# Patient Record
Sex: Male | Born: 1965 | Race: White | Hispanic: No | Smoking: Former smoker
Health system: Southern US, Community
[De-identification: ages and names within clinical notes are randomized; demographics above are authoritative.]

## PROBLEM LIST (undated history)

## (undated) DIAGNOSIS — A6920 Lyme disease, unspecified: Secondary | ICD-10-CM

## (undated) DIAGNOSIS — I10 Essential (primary) hypertension: Secondary | ICD-10-CM

## (undated) DIAGNOSIS — M5030 Other cervical disc degeneration, unspecified cervical region: Secondary | ICD-10-CM

## (undated) DIAGNOSIS — E119 Type 2 diabetes mellitus without complications: Secondary | ICD-10-CM

## (undated) HISTORY — PX: ANKLE SURGERY: SHX546

## (undated) HISTORY — PX: BACK SURGERY: SHX140

## (undated) HISTORY — PX: KNEE SURGERY: SHX244

## (undated) HISTORY — PX: OTHER SURGICAL HISTORY: SHX169

## (undated) HISTORY — PX: SHOULDER SURGERY: SHX246

---

## 2004-02-24 ENCOUNTER — Emergency Department: Payer: Self-pay | Admitting: Emergency Medicine

## 2005-01-04 ENCOUNTER — Emergency Department: Payer: Self-pay | Admitting: Emergency Medicine

## 2015-01-15 ENCOUNTER — Other Ambulatory Visit: Payer: Self-pay | Admitting: Orthopedic Surgery

## 2015-01-15 DIAGNOSIS — M25512 Pain in left shoulder: Secondary | ICD-10-CM

## 2015-01-15 DIAGNOSIS — Z139 Encounter for screening, unspecified: Secondary | ICD-10-CM

## 2015-01-31 ENCOUNTER — Other Ambulatory Visit: Payer: Self-pay | Admitting: Orthopedic Surgery

## 2015-01-31 ENCOUNTER — Ambulatory Visit
Admission: RE | Admit: 2015-01-31 | Discharge: 2015-01-31 | Disposition: A | Discharge status: Home / Self Care | Payer: Medicare Other | Source: Ambulatory Visit | Attending: Orthopedic Surgery | Admitting: Orthopedic Surgery

## 2015-01-31 DIAGNOSIS — M25512 Pain in left shoulder: Secondary | ICD-10-CM

## 2015-01-31 DIAGNOSIS — Z139 Encounter for screening, unspecified: Secondary | ICD-10-CM

## 2015-02-12 ENCOUNTER — Ambulatory Visit
Admission: RE | Admit: 2015-02-12 | Discharge: 2015-02-12 | Disposition: A | Discharge status: Home / Self Care | Payer: Medicare Other | Source: Ambulatory Visit | Attending: Orthopedic Surgery | Admitting: Orthopedic Surgery

## 2015-02-12 DIAGNOSIS — M25512 Pain in left shoulder: Secondary | ICD-10-CM

## 2015-02-12 MED ORDER — IOHEXOL 180 MG/ML  SOLN
15.0000 mL | Freq: Once | INTRAMUSCULAR | Status: DC | PRN
  Administered 2015-02-12: 15 mL via INTRA_ARTICULAR

## 2015-09-07 ENCOUNTER — Emergency Department
Admission: EM | Admit: 2015-09-07 | Discharge: 2015-09-07 | Disposition: A | Discharge status: Home / Self Care | Payer: Medicare Other | Attending: Student | Admitting: Student

## 2015-09-07 ENCOUNTER — Emergency Department: Payer: Medicare Other

## 2015-09-07 DIAGNOSIS — J209 Acute bronchitis, unspecified: Secondary | ICD-10-CM | POA: Diagnosis not present

## 2015-09-07 DIAGNOSIS — R05 Cough: Secondary | ICD-10-CM | POA: Diagnosis present

## 2015-09-07 DIAGNOSIS — J40 Bronchitis, not specified as acute or chronic: Secondary | ICD-10-CM

## 2015-09-07 LAB — URINALYSIS COMPLETE WITH MICROSCOPIC (ARMC ONLY)
BILIRUBIN URINE: NEGATIVE
Bacteria, UA: NONE SEEN
GLUCOSE, UA: NEGATIVE mg/dL
HGB URINE DIPSTICK: NEGATIVE
Ketones, ur: NEGATIVE mg/dL
Leukocytes, UA: NEGATIVE
Nitrite: NEGATIVE
Protein, ur: NEGATIVE mg/dL
SPECIFIC GRAVITY, URINE: 1.017 (ref 1.005–1.030)
SQUAMOUS EPITHELIAL / LPF: NONE SEEN
pH: 7 (ref 5.0–8.0)

## 2015-09-07 LAB — BASIC METABOLIC PANEL
ANION GAP: 9 (ref 5–15)
BUN: 11 mg/dL (ref 6–20)
CALCIUM: 9.5 mg/dL (ref 8.9–10.3)
CO2: 28 mmol/L (ref 22–32)
Chloride: 105 mmol/L (ref 101–111)
Creatinine, Ser: 1.14 mg/dL (ref 0.61–1.24)
GFR calc Af Amer: >60 mL/min (ref >60)
Glucose, Bld: 106 mg/dL — ABNORMAL HIGH (ref 65–99)
POTASSIUM: 3.8 mmol/L (ref 3.5–5.1)
Sodium: 142 mmol/L (ref 135–145)

## 2015-09-07 LAB — CBC
HCT: 44.8 % (ref 40.0–52.0)
HEMOGLOBIN: 15.1 g/dL (ref 13.0–18.0)
MCH: 30.8 pg (ref 26.0–34.0)
MCHC: 33.7 g/dL (ref 32.0–36.0)
MCV: 91.4 fL (ref 80.0–100.0)
Platelets: 188 10*3/uL (ref 150–440)
RBC: 4.9 MIL/uL (ref 4.40–5.90)
RDW: 13.9 % (ref 11.5–14.5)
WBC: 9.9 10*3/uL (ref 3.8–10.6)

## 2015-09-07 LAB — GLUCOSE, CAPILLARY: GLUCOSE-CAPILLARY: 104 mg/dL — AB (ref 65–99)

## 2015-09-07 MED ORDER — AZITHROMYCIN 500 MG PO TABS
500.0000 mg | ORAL_TABLET | Freq: Once | ORAL | Status: AC
  Administered 2015-09-07: 500 mg via ORAL
  Filled 2015-09-07: qty 1

## 2015-09-07 MED ORDER — KETOROLAC TROMETHAMINE 10 MG PO TABS: 10.0000 mg | ORAL_TABLET | Freq: Four times a day (QID) | ORAL | Status: DC | PRN

## 2015-09-07 MED ORDER — KETOROLAC TROMETHAMINE 60 MG/2ML IM SOLN
30.0000 mg | Freq: Once | INTRAMUSCULAR | Status: AC
  Administered 2015-09-07: 30 mg via INTRAMUSCULAR
  Filled 2015-09-07: qty 2

## 2015-09-07 MED ORDER — PREDNISONE 20 MG PO TABS
60.0000 mg | ORAL_TABLET | Freq: Once | ORAL | Status: AC
  Administered 2015-09-07: 60 mg via ORAL
  Filled 2015-09-07: qty 3

## 2015-09-07 MED ORDER — BENZONATATE 100 MG PO CAPS
200.0000 mg | ORAL_CAPSULE | Freq: Once | ORAL | Status: AC
  Administered 2015-09-07: 200 mg via ORAL
  Filled 2015-09-07: qty 2

## 2015-09-07 MED ORDER — HYDROCOD POLST-CPM POLST ER 10-8 MG/5ML PO SUER: 5.0000 mL | Freq: Two times a day (BID) | ORAL | Status: DC

## 2015-09-07 MED ORDER — AZITHROMYCIN 500 MG PO TABS: 500.0000 mg | ORAL_TABLET | Freq: Every day | ORAL | Status: DC

## 2015-09-07 MED ORDER — HYDROCOD POLST-CPM POLST ER 10-8 MG/5ML PO SUER
5.0000 mL | Freq: Once | ORAL | Status: AC
  Administered 2015-09-07: 5 mL via ORAL
  Filled 2015-09-07: qty 5

## 2015-09-07 NOTE — ED Notes (Signed)
Pt placed on med hold at this time, pt made aware and verbalized understanding

## 2015-09-07 NOTE — ED Provider Notes (Signed)
[facility] Emergency Department Provider Note   ____________________________________________  Time seen: Approximately 10:58 PM  I have reviewed the triage vital signs and the nursing notes.   HISTORY  Chief Complaint Cough; Chills; Shortness of Breath; and Dizziness    HPI Jacob Frederick is a 50 y.o. male patient complaining of. Nonproductive cough for 3 days. Patient stated also having body achessecondary to the fourth of cough. Patient denies any sinus congestion or sore throat. Patient state cough increase with laying down. Patient rates his pain discomfort as 7/10. Patient describes his pain as "achy". No palliative measures for this complaint.   No past medical history on file.  There are no active problems to display for this patient.   No past surgical history on file.  Current Outpatient Rx  Name  Route  Sig  Dispense  Refill  . azithromycin (ZITHROMAX) 500 MG tablet   Oral   Take 1 tablet (500 mg total) by mouth daily. Take 1 tablet daily for 3 days.   3 tablet   0   . chlorpheniramine-HYDROcodone (TUSSIONEX PENNKINETIC ER) 10-8 MG/5ML SUER   Oral   Take 5 mLs by mouth 2 (two) times daily.   115 mL   0   . ketorolac (TORADOL) 10 MG tablet   Oral   Take 1 tablet (10 mg total) by mouth every 6 (six) hours as needed.   20 tablet   0     Allergies Iohexol  No family history on file.  Social History Social History  Substance Use Topics  . Smoking status: Not on file  . Smokeless tobacco: Not on file  . Alcohol Use: Not on file    Review of Systems Constitutional: Fever, chills, and body aches. Eyes: No visual changes. ENT: No sore throat. Cardiovascular: Denies chest pain. Respiratory: Shortness of breath secondary to coughing spells Gastrointestinal: No abdominal pain.  No nausea, no vomiting.  No diarrhea.  No constipation. Genitourinary: Negative for dysuria. Musculoskeletal: Negative for back pain. Skin:  Negative for rash. Neurological: Negative for headaches, focal weakness or numbness.  Allergic/Immunilogical:Iohexol.  ____________________________________________   PHYSICAL EXAM:  VITAL SIGNS: ED Triage Vitals  Enc Vitals Group     BP 09/07/15 2119 105/73 mmHg     Pulse Rate 09/07/15 2119 98     Resp 09/07/15 2119 22     Temp 09/07/15 2119 99.2 F (37.3 C)     Temp Source 09/07/15 2119 Oral     SpO2 09/07/15 2119 98 %     Weight 09/07/15 2119 250 lb (113.399 kg)     Height 09/07/15 2119  (1.88 m)     Head Cir --      Peak Flow --      Pain Score 09/07/15 2121 7     Pain Loc --      Pain Edu? --      Excl. in GC? --     Constitutional: Alert and oriented. Well appearing and in no acute distress. Eyes: Conjunctivae are normal. PERRL. EOMI. Head: Atraumatic. Nose: No congestion/rhinnorhea. Mouth/Throat: Mucous membranes are moist.  Oropharynx non-erythematous. Neck: No stridor.  No cervical spine tenderness to palpation. Hematological/Lymphatic/Immunilogical: No cervical lymphadenopathy. Cardiovascular: Normal rate, regular rhythm. Grossly normal heart sounds.  Good peripheral circulation. Respiratory: Normal respiratory effort.  No retractions. Lungs CTAB.Nonproductive cough Gastrointestinal: Soft and nontender. No distention. No abdominal bruits. No CVA tenderness. Musculoskeletal: No lower extremity tenderness nor edema.  No joint effusions. Neurologic:  Normal speech and language. No gross focal neurologic deficits are appreciated. No gait instability. Skin:  Skin is warm, dry and intact. No rash noted. Psychiatric: Mood and affect are normal. Speech and behavior are normal.  ____________________________________________   LABS (all labs ordered are listed, but only abnormal results are displayed)  Labs Reviewed  BASIC METABOLIC PANEL - Abnormal; Notable for the following:    Glucose, Bld 106 (*)    All other components within normal limits  URINALYSIS  COMPLETEWITH MICROSCOPIC ([facility] ONLY) - Abnormal; Notable for the following:    Color, Urine YELLOW (*)    APPearance CLEAR (*)    All other components within normal limits  GLUCOSE, CAPILLARY - Abnormal; Notable for the following:    Glucose-Capillary 104 (*)    All other components within normal limits  CBC  CBG MONITORING, ED   ____________________________________________  EKG EKG read by the heart station doctor with no acute findings.  ____________________________________________  RADIOLOGY  No acute findings. Perbronchial thickening is noted. ____________________________________________   PROCEDURES  Procedure(s) performed: None  Critical Care performed: No  ____________________________________________   INITIAL IMPRESSION / ASSESSMENT AND PLAN / ED COURSE  Pertinent labs & imaging results that were available during my care of the patient were reviewed by me and considered in my medical decision making (see chart for details).  Bronchitis. Patient given discharge care instructions. Patient given a prescription for Zithromax, Tussionex, and Toradol. ____________________________________________   FINAL CLINICAL IMPRESSION(S) / ED DIAGNOSES  Final diagnoses:  Bronchitis      NEW MEDICATIONS STARTED DURING THIS VISIT:  New Prescriptions   AZITHROMYCIN (ZITHROMAX) 500 MG TABLET    Take 1 tablet (500 mg total) by mouth daily. Take 1 tablet daily for 3 days.   CHLORPHENIRAMINE-HYDROCODONE (TUSSIONEX PENNKINETIC ER) 10-8 MG/5ML SUER    Take 5 mLs by mouth 2 (two) times daily.   KETOROLAC (TORADOL) 10 MG TABLET    Take 1 tablet (10 mg total) by mouth every 6 (six) hours as needed.     Note:  This document was prepared using Dragon voice recognition software and may include unintentional dictation errors.    Joni Reiningonald K Tanielle Emigh, PA-C 09/07/15 2321  Joni Reiningonald K Keayra Graham, PA-C 09/07/15 2322  Gayla DossEryka A Gayle, MD 09/08/15 [id number]

## 2015-09-21 ENCOUNTER — Encounter: Payer: Self-pay | Admitting: Emergency Medicine

## 2015-09-21 ENCOUNTER — Emergency Department
Admission: EM | Admit: 2015-09-21 | Discharge: 2015-09-21 | Disposition: A | Discharge status: Home / Self Care | Payer: Medicare Other | Attending: Emergency Medicine | Admitting: Emergency Medicine

## 2015-09-21 ENCOUNTER — Emergency Department: Payer: Medicare Other

## 2015-09-21 DIAGNOSIS — F17210 Nicotine dependence, cigarettes, uncomplicated: Secondary | ICD-10-CM | POA: Diagnosis not present

## 2015-09-21 DIAGNOSIS — E119 Type 2 diabetes mellitus without complications: Secondary | ICD-10-CM | POA: Diagnosis not present

## 2015-09-21 DIAGNOSIS — I10 Essential (primary) hypertension: Secondary | ICD-10-CM | POA: Diagnosis not present

## 2015-09-21 DIAGNOSIS — J40 Bronchitis, not specified as acute or chronic: Secondary | ICD-10-CM | POA: Diagnosis not present

## 2015-09-21 DIAGNOSIS — R05 Cough: Secondary | ICD-10-CM | POA: Diagnosis present

## 2015-09-21 HISTORY — DX: Essential (primary) hypertension: I10

## 2015-09-21 HISTORY — DX: Type 2 diabetes mellitus without complications: E11.9

## 2015-09-21 LAB — CBC
HCT: 47 % (ref 40.0–52.0)
HEMOGLOBIN: 15.9 g/dL (ref 13.0–18.0)
MCH: 31.1 pg (ref 26.0–34.0)
MCHC: 33.8 g/dL (ref 32.0–36.0)
MCV: 91.9 fL (ref 80.0–100.0)
Platelets: 181 10*3/uL (ref 150–440)
RBC: 5.12 MIL/uL (ref 4.40–5.90)
RDW: 13.7 % (ref 11.5–14.5)
WBC: 14.6 10*3/uL — ABNORMAL HIGH (ref 3.8–10.6)

## 2015-09-21 LAB — COMPREHENSIVE METABOLIC PANEL WITH GFR
ALT: 28 U/L (ref 17–63)
AST: 26 U/L (ref 15–41)
Albumin: 4.2 g/dL (ref 3.5–5.0)
Alkaline Phosphatase: 65 U/L (ref 38–126)
Anion gap: 9 (ref 5–15)
BUN: 12 mg/dL (ref 6–20)
CO2: 25 mmol/L (ref 22–32)
Calcium: 9.2 mg/dL (ref 8.9–10.3)
Chloride: 104 mmol/L (ref 101–111)
Creatinine, Ser: 0.82 mg/dL (ref 0.61–1.24)
GFR calc Af Amer: >60 mL/min
GFR calc non Af Amer: >60 mL/min
Glucose, Bld: 157 mg/dL — ABNORMAL HIGH (ref 65–99)
Potassium: 4.5 mmol/L (ref 3.5–5.1)
Sodium: 138 mmol/L (ref 135–145)
Total Bilirubin: 1 mg/dL (ref 0.3–1.2)
Total Protein: 6.7 g/dL (ref 6.5–8.1)

## 2015-09-21 MED ORDER — PREDNISONE 10 MG (21) PO TBPK: ORAL_TABLET | ORAL | Status: DC

## 2015-09-21 MED ORDER — ALBUTEROL SULFATE HFA 108 (90 BASE) MCG/ACT IN AERS: 2.0000 | INHALATION_SPRAY | Freq: Four times a day (QID) | RESPIRATORY_TRACT | Status: DC | PRN

## 2015-09-21 MED ORDER — IPRATROPIUM-ALBUTEROL 0.5-2.5 (3) MG/3ML IN SOLN
3.0000 mL | Freq: Once | RESPIRATORY_TRACT | Status: AC
  Administered 2015-09-21: 3 mL via RESPIRATORY_TRACT
  Filled 2015-09-21: qty 3

## 2015-09-21 MED ORDER — HYDROCOD POLST-CPM POLST ER 10-8 MG/5ML PO SUER: 5.0000 mL | Freq: Two times a day (BID) | ORAL | Status: DC

## 2015-09-21 NOTE — ED Notes (Signed)
States was seen originally about 14 days ago and prescribed z pak for bronchitis. [facility] and [facility], put on doxycycline. Continues cough.

## 2015-09-21 NOTE — Discharge Instructions (Signed)
How to Use an Inhaler °Proper inhaler technique is very important. Good technique ensures that the medicine reaches the lungs. Poor technique results in depositing the medicine on the tongue and back of the throat rather than in the airways. If you do not use the inhaler with good technique, the medicine will not help you. °STEPS TO FOLLOW IF USING AN INHALER WITHOUT AN EXTENSION TUBE °· Remove the cap from the inhaler. °· If you are using the inhaler for the first time, you will need to prime it. Shake the inhaler for 5 seconds and release four puffs into the air, away from your face. Ask your health care provider or pharmacist if you have questions about priming your inhaler. °· Shake the inhaler for 5 seconds before each breath in (inhalation). °· Position the inhaler so that the top of the canister faces up. °· Put your index finger on the top of the medicine canister. Your thumb supports the bottom of the inhaler. °· Open your mouth. °· Either place the inhaler between your teeth and place your lips tightly around the mouthpiece, or hold the inhaler 1-2 inches away from your open mouth. If you are unsure of which technique to use, ask your health care provider. °· Breathe out (exhale) normally and as completely as possible. °· Press the canister down with your index finger to release the medicine. °· At the same time as the canister is pressed, inhale deeply and slowly until your lungs are completely filled. This should take 4-6 seconds. Keep your tongue down. °· Hold the medicine in your lungs for 5-10 seconds (10 seconds is best). This helps the medicine get into the small airways of your lungs. °· Breathe out slowly, through pursed lips. Whistling is an example of pursed lips. °· Wait at least 15-30 seconds between puffs. Continue with the above steps until you have taken the number of puffs your health care provider has ordered. Do not use the inhaler more than your health care provider tells  you. °· Replace the cap on the inhaler. °· Follow the directions from your health care provider or the inhaler insert for cleaning the inhaler. °STEPS TO FOLLOW IF USING AN INHALER WITH AN EXTENSION (SPACER) °· Remove the cap from the inhaler. °· If you are using the inhaler for the first time, you will need to prime it. Shake the inhaler for 5 seconds and release four puffs into the air, away from your face. Ask your health care provider or pharmacist if you have questions about priming your inhaler. °· Shake the inhaler for 5 seconds before each breath in (inhalation). °· Place the open end of the spacer onto the mouthpiece of the inhaler. °· Position the inhaler so that the top of the canister faces up and the spacer mouthpiece faces you. °· Put your index finger on the top of the medicine canister. Your thumb supports the bottom of the inhaler and the spacer. °· Breathe out (exhale) normally and as completely as possible. °· Immediately after exhaling, place the spacer between your teeth and into your mouth. Close your lips tightly around the spacer. °· Press the canister down with your index finger to release the medicine. °· At the same time as the canister is pressed, inhale deeply and slowly until your lungs are completely filled. This should take 4-6 seconds. Keep your tongue down and out of the way. °· Hold the medicine in your lungs for 5-10 seconds (10 seconds is best). This helps the   medicine get into the small airways of your lungs. Exhale. °· Repeat inhaling deeply through the spacer mouthpiece. Again hold that breath for up to 10 seconds (10 seconds is best). Exhale slowly. If it is difficult to take this second deep breath through the spacer, breathe normally several times through the spacer. Remove the spacer from your mouth. °· Wait at least 15-30 seconds between puffs. Continue with the above steps until you have taken the number of puffs your health care provider has ordered. Do not use the  inhaler more than your health care provider tells you. °· Remove the spacer from the inhaler, and place the cap on the inhaler. °· Follow the directions from your health care provider or the inhaler insert for cleaning the inhaler and spacer. °If you are using different kinds of inhalers, use your quick relief medicine to open the airways 10-15 minutes before using a steroid if instructed to do so by your health care provider. If you are unsure which inhalers to use and the order of using them, ask your health care provider, nurse, or respiratory therapist. °If you are using a steroid inhaler, always rinse your mouth with water after your last puff, then gargle and spit out the water. Do not swallow the water. °AVOID: °· Inhaling before or after starting the spray of medicine. It takes practice to coordinate your breathing with triggering the spray. °· Inhaling through the nose (rather than the mouth) when triggering the spray. °HOW TO DETERMINE IF YOUR INHALER IS FULL OR NEARLY EMPTY °You cannot know when an inhaler is empty by shaking it. A few inhalers are now being made with dose counters. Ask your health care provider for a prescription that has a dose counter if you feel you need that extra help. If your inhaler does not have a counter, ask your health care provider to help you determine the date you need to refill your inhaler. Write the refill date on a calendar or your inhaler canister. Refill your inhaler 7-10 days before it runs out. Be sure to keep an adequate supply of medicine. This includes making sure it is not expired, and that you have a spare inhaler.  °SEEK MEDICAL CARE IF:  °· Your symptoms are only partially relieved with your inhaler. °· You are having trouble using your inhaler. °· You have some increase in phlegm. °SEEK IMMEDIATE MEDICAL CARE IF:  °· You feel little or no relief with your inhalers. You are still wheezing and are feeling shortness of breath or tightness in your chest or  both. °· You have dizziness, headaches, or a fast heart rate. °· You have chills, fever, or night sweats. °· You have a noticeable increase in phlegm production, or there is blood in the phlegm. °MAKE SURE YOU:  °· Understand these instructions. °· Will watch your condition. °· Will get help right away if you are not doing well or get worse. °  °This information is not intended to replace advice given to you by your health care provider. Make sure you discuss any questions you have with your health care provider. °  °Document Released: 04/03/2000 Document Revised: 01/25/2013 Document Reviewed: 11/03/2012 °Elsevier Interactive Patient Education ©2016 [employer] °Acute Bronchitis °Bronchitis is inflammation of the airways that extend from the windpipe into the lungs (bronchi). The inflammation often causes mucus to develop. This leads to a cough, which is the most common symptom of bronchitis.  °In acute bronchitis, the condition usually develops suddenly and goes away   over time, usually in a couple weeks. Smoking, allergies, and asthma can make bronchitis worse. Repeated episodes of bronchitis may cause further lung problems.  °CAUSES °Acute bronchitis is most often caused by the same virus that causes a cold. The virus can spread from person to person (contagious) through coughing, sneezing, and touching contaminated objects. °SIGNS AND SYMPTOMS  °· Cough.   °· Fever.   °· Coughing up mucus.   °· Body aches.   °· Chest congestion.   °· Chills.   °· Shortness of breath.   °· Sore throat.   °DIAGNOSIS  °Acute bronchitis is usually diagnosed through a physical exam. Your health care provider will also ask you questions about your medical history. Tests, such as chest X-rays, are sometimes done to rule out other conditions.  °TREATMENT  °Acute bronchitis usually goes away in a couple weeks. Oftentimes, no medical treatment is necessary. Medicines are sometimes given for relief of fever or cough. Antibiotic medicines  are usually not needed but may be prescribed in certain situations. In some cases, an inhaler may be recommended to help reduce shortness of breath and control the cough. A cool mist vaporizer may also be used to help thin bronchial secretions and make it easier to clear the chest.  °HOME CARE INSTRUCTIONS °· Get plenty of rest.   °· Drink enough fluids to keep your urine clear or pale yellow (unless you have a medical condition that requires fluid restriction). Increasing fluids may help thin your respiratory secretions (sputum) and reduce chest congestion, and it will prevent dehydration.   °· Take medicines only as directed by your health care provider. °· If you were prescribed an antibiotic medicine, finish it all even if you start to feel better. °· Avoid smoking and secondhand smoke. Exposure to cigarette smoke or irritating chemicals will make bronchitis worse. If you are a smoker, consider using nicotine gum or skin patches to help control withdrawal symptoms. Quitting smoking will help your lungs heal faster.   °· Reduce the chances of another bout of acute bronchitis by washing your hands frequently, avoiding people with cold symptoms, and trying not to touch your hands to your mouth, nose, or eyes.   °· Keep all follow-up visits as directed by your health care provider.   °SEEK MEDICAL CARE IF: °Your symptoms do not improve after 1 week of treatment.  °SEEK IMMEDIATE MEDICAL CARE IF: °· You develop an increased fever or chills.   °· You have chest pain.   °· You have severe shortness of breath. °· You have bloody sputum.   °· You develop dehydration. °· You faint or repeatedly feel like you are going to pass out. °· You develop repeated vomiting. °· You develop a severe headache. °MAKE SURE YOU:  °· Understand these instructions. °· Will watch your condition. °· Will get help right away if you are not doing well or get worse. °  °This information is not intended to replace advice given to you by your  health care provider. Make sure you discuss any questions you have with your health care provider. °  °Document Released: 05/14/2004 Document Revised: 04/27/2014 Document Reviewed: 09/27/2012 °Elsevier Interactive Patient Education ©2016 [employer] ° °

## 2015-09-21 NOTE — ED Provider Notes (Signed)
[facility] Emergency Department Provider Note        Time seen: ----------------------------------------- 3:55 PM on 09/21/2015 -----------------------------------------    I have reviewed the triage vital signs and the nursing notes.   HISTORY  Chief Complaint Cough    HPI Jacob Frederick is a 50 y.o. male who presents to ER for a recheck of his coughing. Patient was seen 14 days ago and prescribed a Z-Pak for bronchitis. 3 days later he was seen at [facility], diagnosed with pneumonia clinically and placed on doxycycline. Patient states have a cough, felt like he had a relapse of his symptoms. He denies fevers chills or other complaints. Patient very angry because he was given Z-Pak at the first visit. He states Z-Pak does not do anything for him and he knows his body.   Past Medical History  Diagnosis Date  . Diabetes mellitus without complication (HCC)   . Hypertension     There are no active problems to display for this patient.   Past Surgical History  Procedure Laterality Date  . Shoulder surgery    . Ankle surgery      Allergies Iohexol  Social History Social History  Substance Use Topics  . Smoking status: Current Some Day Smoker    Types: Cigarettes  . Smokeless tobacco: None  . Alcohol Use: Yes   Review of Systems Constitutional: Negative for fever. Eyes: Negative for visual changes. ENT: Negative for sore throat. Cardiovascular: Negative for chest pain. Respiratory: Positive for shortness of breath and cough Gastrointestinal: Negative for abdominal pain, vomiting and diarrhea. Genitourinary: Negative for dysuria. Musculoskeletal: Negative for back pain. Skin: Negative for rash. Neurological: Negative for headaches, focal weakness or numbness.  10-point ROS otherwise negative.  ____________________________________________   PHYSICAL EXAM:  VITAL SIGNS: ED Triage Vitals  Enc Vitals Group     BP 09/21/15  1200 118/84 mmHg     Pulse Rate 09/21/15 1200 101     Resp 09/21/15 1200 20     Temp 09/21/15 1200 98.9 F (37.2 C)     Temp Source 09/21/15 1200 Oral     SpO2 09/21/15 1200 94 %     Weight 09/21/15 1200 255 lb (115.667 kg)     Height 09/21/15 1200  (1.88 m)     Head Cir --      Peak Flow --      Pain Score 09/21/15 1202 6     Pain Loc --      Pain Edu? --      Excl. in GC? --    Constitutional: Alert and oriented. Well appearing and in no distress. Eyes: Conjunctivae are normal. PERRL. Normal extraocular movements. ENT   Head: Normocephalic and atraumatic.   Nose: No congestion/rhinnorhea.   Mouth/Throat: Mucous membranes are moist.   Neck: No stridor. Cardiovascular: Normal rate, regular rhythm. No murmurs, rubs, or gallops. Respiratory: Normal respiratory effort without tachypnea nor retractions. Bilateral rhonchi is noted Gastrointestinal: Soft and nontender. Normal bowel sounds Musculoskeletal: Nontender with normal range of motion in all extremities. No lower extremity tenderness nor edema. Neurologic:  Normal speech and language. No gross focal neurologic deficits are appreciated.  Skin:  Skin is warm, dry and intact. No rash noted. Psychiatric: Hostile mood and affect at times.  ____________________________________________  ED COURSE:  Pertinent labs & imaging results that were available during my care of the patient were reviewed by me and considered in my medical decision making (see chart for details). Patient  presents with worsening of symptoms that were diagnosed with pneumonia. I will check basic labs and imaging. ____________________________________________    LABS (pertinent positives/negatives)  Labs Reviewed  CBC - Abnormal; Notable for the following:    WBC 14.6 (*)    All other components within normal limits  COMPREHENSIVE METABOLIC PANEL - Abnormal; Notable for the following:    Glucose, Bld 157 (*)    All other components within  normal limits    RADIOLOGY  IMPRESSION: Stable mild bronchitic change.  ____________________________________________  FINAL ASSESSMENT AND PLAN  Bronchitis  Plan: Patient with labs and imaging as dictated above. Patient with a persistent bronchitis, he'll be given albuterol start taking daily. I do not think additional antibiotics would be beneficial as she has completed a course of azithromycin and doxycycline. We will recommend a longer prednisone taper.   Emily FilbertWilliams, Cindel Daugherty E, MD   Note: This dictation was prepared with Dragon dictation. Any transcriptional errors that result from this process are unintentional   Emily FilbertJonathan E Jessica Checketts, MD 09/21/15 [phone]

## 2016-03-27 ENCOUNTER — Ambulatory Visit: Payer: Medicare Other | Attending: Otolaryngology

## 2016-03-27 DIAGNOSIS — R0683 Snoring: Secondary | ICD-10-CM | POA: Diagnosis not present

## 2016-03-27 DIAGNOSIS — F5101 Primary insomnia: Secondary | ICD-10-CM | POA: Insufficient documentation

## 2016-07-23 ENCOUNTER — Ambulatory Visit: Payer: TRICARE For Life (TFL) | Admitting: Podiatry

## 2016-10-11 ENCOUNTER — Emergency Department: Payer: Medicare Other

## 2016-10-11 ENCOUNTER — Other Ambulatory Visit: Payer: Medicare Other

## 2016-10-11 ENCOUNTER — Encounter: Payer: Self-pay | Admitting: Emergency Medicine

## 2016-10-11 ENCOUNTER — Emergency Department
Admission: EM | Admit: 2016-10-11 | Discharge: 2016-10-11 | Disposition: A | Discharge status: Home / Self Care | Payer: Medicare Other | Attending: Emergency Medicine | Admitting: Emergency Medicine

## 2016-10-11 DIAGNOSIS — M2550 Pain in unspecified joint: Secondary | ICD-10-CM | POA: Diagnosis not present

## 2016-10-11 DIAGNOSIS — M791 Myalgia, unspecified site: Secondary | ICD-10-CM

## 2016-10-11 DIAGNOSIS — I10 Essential (primary) hypertension: Secondary | ICD-10-CM | POA: Diagnosis not present

## 2016-10-11 DIAGNOSIS — R609 Edema, unspecified: Secondary | ICD-10-CM | POA: Diagnosis not present

## 2016-10-11 DIAGNOSIS — Z87891 Personal history of nicotine dependence: Secondary | ICD-10-CM | POA: Insufficient documentation

## 2016-10-11 DIAGNOSIS — R6883 Chills (without fever): Secondary | ICD-10-CM | POA: Insufficient documentation

## 2016-10-11 DIAGNOSIS — W57XXXS Bitten or stung by nonvenomous insect and other nonvenomous arthropods, sequela: Secondary | ICD-10-CM | POA: Insufficient documentation

## 2016-10-11 DIAGNOSIS — E119 Type 2 diabetes mellitus without complications: Secondary | ICD-10-CM | POA: Diagnosis not present

## 2016-10-11 DIAGNOSIS — R2243 Localized swelling, mass and lump, lower limb, bilateral: Secondary | ICD-10-CM | POA: Diagnosis present

## 2016-10-11 HISTORY — DX: Other cervical disc degeneration, unspecified cervical region: M50.30

## 2016-10-11 LAB — CBC WITH DIFFERENTIAL/PLATELET
BASOS PCT: 2 %
Basophils Absolute: 0.1 10*3/uL (ref 0–0.1)
Eosinophils Absolute: 0.2 10*3/uL (ref 0–0.7)
Eosinophils Relative: 3 %
HEMATOCRIT: 44.4 % (ref 40.0–52.0)
HEMOGLOBIN: 14.9 g/dL (ref 13.0–18.0)
LYMPHS PCT: 29 %
Lymphs Abs: 2.1 10*3/uL (ref 1.0–3.6)
MCH: 30.7 pg (ref 26.0–34.0)
MCHC: 33.5 g/dL (ref 32.0–36.0)
MCV: 91.5 fL (ref 80.0–100.0)
MONO ABS: 0.6 10*3/uL (ref 0.2–1.0)
MONOS PCT: 8 %
NEUTROS ABS: 4.2 10*3/uL (ref 1.4–6.5)
Neutrophils Relative %: 58 %
Platelets: 181 10*3/uL (ref 150–440)
RBC: 4.86 MIL/uL (ref 4.40–5.90)
RDW: 13.9 % (ref 11.5–14.5)
WBC: 7.2 10*3/uL (ref 3.8–10.6)

## 2016-10-11 LAB — COMPREHENSIVE METABOLIC PANEL
ALK PHOS: 102 U/L (ref 38–126)
ALT: 33 U/L (ref 17–63)
ANION GAP: 10 (ref 5–15)
AST: 33 U/L (ref 15–41)
Albumin: 4.3 g/dL (ref 3.5–5.0)
BUN: 9 mg/dL (ref 6–20)
CO2: 29 mmol/L (ref 22–32)
CREATININE: 0.85 mg/dL (ref 0.61–1.24)
Calcium: 9.3 mg/dL (ref 8.9–10.3)
Chloride: 99 mmol/L — ABNORMAL LOW (ref 101–111)
Glucose, Bld: 135 mg/dL — ABNORMAL HIGH (ref 65–99)
Potassium: 3.6 mmol/L (ref 3.5–5.1)
Sodium: 138 mmol/L (ref 135–145)
Total Bilirubin: 0.6 mg/dL (ref 0.3–1.2)
Total Protein: 7.2 g/dL (ref 6.5–8.1)

## 2016-10-11 LAB — POCT RAPID STREP A: STREPTOCOCCUS, GROUP A SCREEN (DIRECT): NEGATIVE

## 2016-10-11 LAB — BRAIN NATRIURETIC PEPTIDE: B NATRIURETIC PEPTIDE 5: 7 pg/mL (ref 0.0–100.0)

## 2016-10-11 LAB — TROPONIN I: Troponin I: <0.03 ng/mL (ref <0.03)

## 2016-10-11 MED ORDER — DOXYCYCLINE HYCLATE 100 MG PO TABS: 100.0000 mg | ORAL_TABLET | Freq: Two times a day (BID) | ORAL | 0 refills | Status: DC

## 2016-10-11 MED ORDER — TRAMADOL HCL 50 MG PO TABS: 50.0000 mg | ORAL_TABLET | Freq: Four times a day (QID) | ORAL | 0 refills | Status: DC | PRN

## 2016-10-11 MED ORDER — KETOROLAC TROMETHAMINE 30 MG/ML IJ SOLN
30.0000 mg | Freq: Once | INTRAMUSCULAR | Status: AC
  Administered 2016-10-11: 30 mg via INTRAVENOUS
  Filled 2016-10-11: qty 1

## 2016-10-11 MED ORDER — IBUPROFEN 800 MG PO TABS: 800.0000 mg | ORAL_TABLET | Freq: Three times a day (TID) | ORAL | 0 refills | Status: DC | PRN

## 2016-10-11 MED ORDER — DOXYCYCLINE HYCLATE 100 MG IV SOLR
100.0000 mg | Freq: Once | INTRAVENOUS | Status: AC
  Administered 2016-10-11: 100 mg via INTRAVENOUS
  Filled 2016-10-11: qty 100

## 2016-10-11 MED ORDER — LIDOCAINE VISCOUS 2 % MT SOLN
15.0000 mL | Freq: Once | OROMUCOSAL | Status: AC
  Administered 2016-10-11: 15 mL via OROMUCOSAL
  Filled 2016-10-11: qty 15

## 2016-10-11 MED ORDER — BARIUM SULFATE 2.1 % PO SUSP
450.0000 mL | ORAL | Status: AC
  Administered 2016-10-11 (×2): 450 mL via ORAL
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
## 2016-10-11 NOTE — ED Triage Notes (Signed)
Patient states that he started having sore throat, congestion, chills and bilateral leg swelling.

## 2016-10-11 NOTE — ED Notes (Signed)
Pt wife came and said that he is starting to feel tingling and numbness in toes and fingers.

## 2016-10-11 NOTE — Discharge Instructions (Signed)
Please follow up with your primary care physician for further evaluation. Please take the antibiotics and the anti inflammatories. I am treating your for the possibility of a tick bourne illness. Please return with any worsened symptoms or worsened condition.

## 2016-10-11 NOTE — ED Notes (Signed)
Patient transported to CT 

## 2016-10-11 NOTE — ED Notes (Signed)
Pt states that he has edema in both ankles and fingers which started today. He states having chills and a rash on his back and chest. Reported having been bit by a tick on June 1st, was seen by primary who said it looked fine.

## 2016-10-13 LAB — ROCKY MTN SPOTTED FVR ABS PNL(IGG+IGM)
RMSF IGG: NEGATIVE
RMSF IGM: 0.3 {index} (ref 0.00–0.89)

## 2016-10-16 LAB — CULTURE, BLOOD (ROUTINE X 2)
Culture: NO GROWTH
Culture: NO GROWTH
Special Requests: ADEQUATE
Special Requests: ADEQUATE

## 2017-01-09 IMAGING — CR DG ORBITS FOR FOREIGN BODY
2 series · 2 of 2 positions shown · non-contrast
Comparison: None.

CLINICAL DATA: Metal working/exposure; clearance prior to MRI

EXAM:
ORBITS FOR FOREIGN BODY - 2 VIEW

[w orbit pa (1 of 2)]
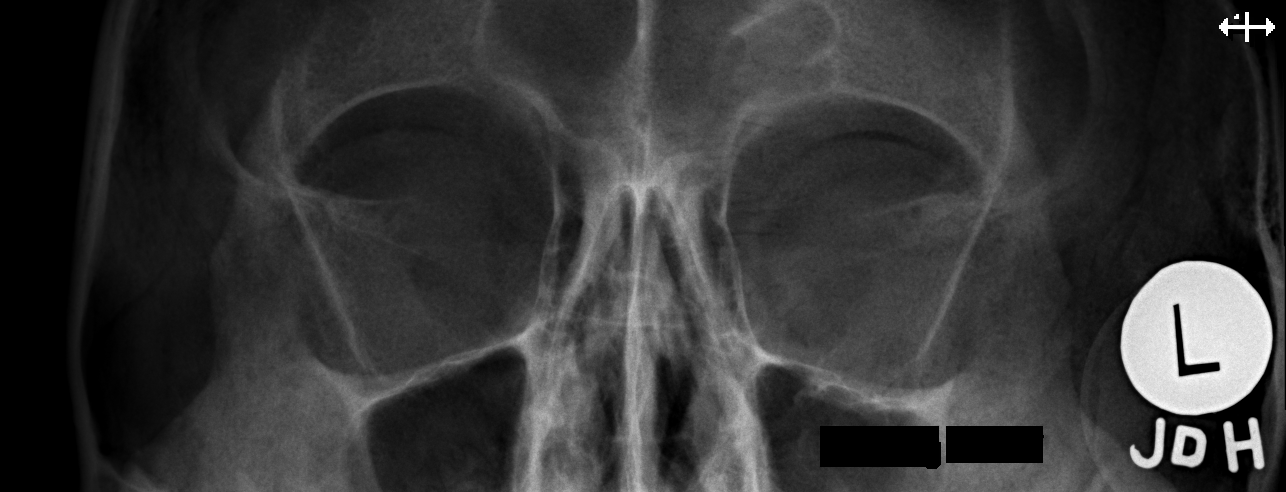

[w orbit pa (2 of 2)]
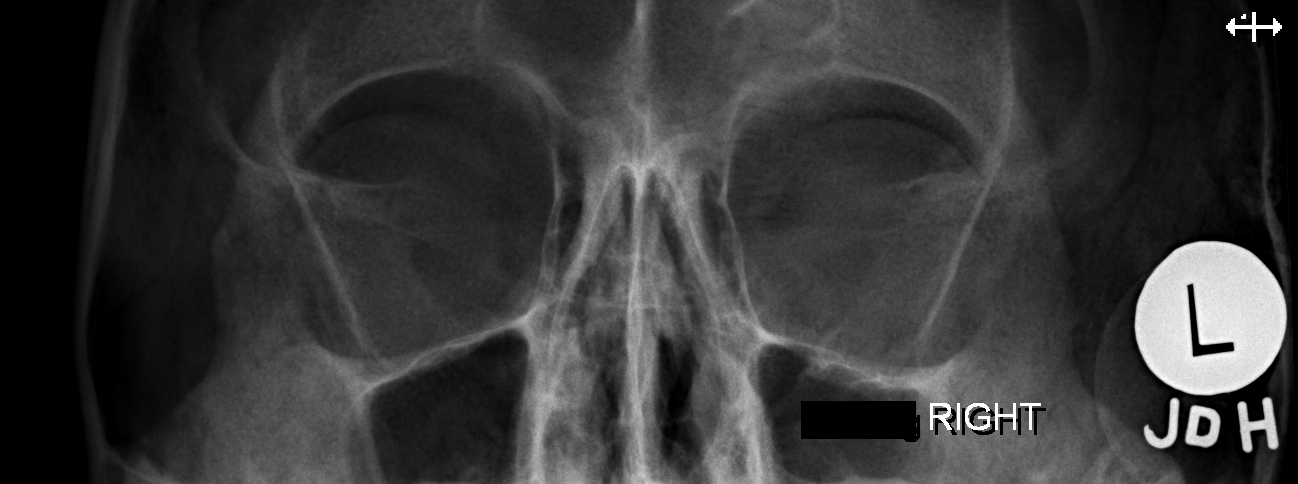

[2 of 2 positions shown; findings below may reference images not displayed]

FINDINGS: There is no evidence of metallic foreign body within the orbits. No
significant bone abnormality identified.
IMPRESSION: No evidence of metallic foreign body within the orbits.

## 2019-05-10 ENCOUNTER — Other Ambulatory Visit: Payer: Self-pay

## 2019-05-10 ENCOUNTER — Encounter: Payer: Self-pay | Admitting: Emergency Medicine

## 2019-05-10 ENCOUNTER — Emergency Department: Payer: Medicare Other

## 2019-05-10 DIAGNOSIS — Y998 Other external cause status: Secondary | ICD-10-CM | POA: Insufficient documentation

## 2019-05-10 DIAGNOSIS — Y9389 Activity, other specified: Secondary | ICD-10-CM | POA: Insufficient documentation

## 2019-05-10 DIAGNOSIS — W19XXXA Unspecified fall, initial encounter: Secondary | ICD-10-CM | POA: Diagnosis not present

## 2019-05-10 DIAGNOSIS — Y92010 Kitchen of single-family (private) house as the place of occurrence of the external cause: Secondary | ICD-10-CM | POA: Insufficient documentation

## 2019-05-10 DIAGNOSIS — S0990XA Unspecified injury of head, initial encounter: Secondary | ICD-10-CM | POA: Diagnosis present

## 2019-05-10 DIAGNOSIS — Z87891 Personal history of nicotine dependence: Secondary | ICD-10-CM | POA: Diagnosis not present

## 2019-05-10 DIAGNOSIS — E1165 Type 2 diabetes mellitus with hyperglycemia: Secondary | ICD-10-CM | POA: Diagnosis not present

## 2019-05-10 DIAGNOSIS — I10 Essential (primary) hypertension: Secondary | ICD-10-CM | POA: Diagnosis not present

## 2019-05-10 DIAGNOSIS — S0181XA Laceration without foreign body of other part of head, initial encounter: Secondary | ICD-10-CM | POA: Diagnosis not present

## 2019-05-10 LAB — BASIC METABOLIC PANEL
Anion gap: 15 (ref 5–15)
BUN: 10 mg/dL (ref 6–20)
CO2: 22 mmol/L (ref 22–32)
Calcium: 9.3 mg/dL (ref 8.9–10.3)
Chloride: 97 mmol/L — ABNORMAL LOW (ref 98–111)
Creatinine, Ser: 0.91 mg/dL (ref 0.61–1.24)
GFR calc Af Amer: >60 mL/min (ref >60)
GFR calc non Af Amer: >60 mL/min (ref >60)
Glucose, Bld: 341 mg/dL — ABNORMAL HIGH (ref 70–99)
Potassium: 4.5 mmol/L (ref 3.5–5.1)
Sodium: 134 mmol/L — ABNORMAL LOW (ref 135–145)

## 2019-05-10 LAB — CBC
HCT: 50.7 % (ref 39.0–52.0)
Hemoglobin: 17.6 g/dL — ABNORMAL HIGH (ref 13.0–17.0)
MCH: 32.5 pg (ref 26.0–34.0)
MCHC: 34.7 g/dL (ref 30.0–36.0)
MCV: 93.7 fL (ref 80.0–100.0)
Platelets: 255 10*3/uL (ref 150–400)
RBC: 5.41 MIL/uL (ref 4.22–5.81)
RDW: 11.7 % (ref 11.5–15.5)
WBC: 10.8 10*3/uL — ABNORMAL HIGH (ref 4.0–10.5)
nRBC: 0 % (ref 0.0–0.2)

## 2019-05-10 LAB — TROPONIN I (HIGH SENSITIVITY): Troponin I (High Sensitivity): 4 ng/L (ref <18)

## 2019-05-10 NOTE — ED Triage Notes (Signed)
Patient to ER for c/o fall. Patient states he was kneeling down and went to stand up. Patient states he is not sure what happened, but fell forwards onto face. Patient has laceration above right eye brow. Patient denies syncopal episode.

## 2019-05-11 ENCOUNTER — Emergency Department
Admission: EM | Admit: 2019-05-11 | Discharge: 2019-05-11 | Disposition: A | Discharge status: Home / Self Care | Payer: Medicare Other | Attending: Emergency Medicine | Admitting: Emergency Medicine

## 2019-05-11 DIAGNOSIS — R739 Hyperglycemia, unspecified: Secondary | ICD-10-CM

## 2019-05-11 DIAGNOSIS — S0181XA Laceration without foreign body of other part of head, initial encounter: Secondary | ICD-10-CM

## 2019-05-11 DIAGNOSIS — W19XXXA Unspecified fall, initial encounter: Secondary | ICD-10-CM

## 2019-05-11 HISTORY — DX: Lyme disease, unspecified: A69.20

## 2019-05-11 LAB — TROPONIN I (HIGH SENSITIVITY): Troponin I (High Sensitivity): 3 ng/L (ref <18)

## 2019-05-11 MED ORDER — SODIUM CHLORIDE 0.9 % IV BOLUS
1000.0000 mL | Freq: Once | INTRAVENOUS | Status: AC
  Administered 2019-05-11: 03:00:00 1000 mL via INTRAVENOUS
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
## 2019-05-11 NOTE — Discharge Instructions (Signed)
Please take your medications daily as directed by your doctor.  Steri-Strips will fall off in about 5 to 7 days.  You may peel them off if they are still adhered after that time.  Drink plenty of fluids daily.  Return to the ER for worsening symptoms, persistent vomiting, difficulty breathing or other concerns.

## 2020-09-23 ENCOUNTER — Inpatient Hospital Stay
Admission: EM | Admit: 2020-09-23 | Discharge: 2020-09-27 | DRG: 896 | Disposition: A | Discharge status: Home Health | Payer: Medicare Other | Attending: Internal Medicine | Admitting: Internal Medicine

## 2020-09-23 ENCOUNTER — Other Ambulatory Visit: Payer: Self-pay

## 2020-09-23 DIAGNOSIS — Z794 Long term (current) use of insulin: Secondary | ICD-10-CM

## 2020-09-23 DIAGNOSIS — Y908 Blood alcohol level of 240 mg/100 ml or more: Secondary | ICD-10-CM | POA: Diagnosis present

## 2020-09-23 DIAGNOSIS — Z91041 Radiographic dye allergy status: Secondary | ICD-10-CM

## 2020-09-23 DIAGNOSIS — E872 Acidosis: Principal | ICD-10-CM

## 2020-09-23 DIAGNOSIS — F332 Major depressive disorder, recurrent severe without psychotic features: Secondary | ICD-10-CM

## 2020-09-23 DIAGNOSIS — E878 Other disorders of electrolyte and fluid balance, not elsewhere classified: Secondary | ICD-10-CM | POA: Diagnosis present

## 2020-09-23 DIAGNOSIS — K219 Gastro-esophageal reflux disease without esophagitis: Secondary | ICD-10-CM | POA: Diagnosis present

## 2020-09-23 DIAGNOSIS — Z79890 Hormone replacement therapy (postmenopausal): Secondary | ICD-10-CM

## 2020-09-23 DIAGNOSIS — Z87891 Personal history of nicotine dependence: Secondary | ICD-10-CM

## 2020-09-23 DIAGNOSIS — E1110 Type 2 diabetes mellitus with ketoacidosis without coma: Secondary | ICD-10-CM | POA: Diagnosis present

## 2020-09-23 DIAGNOSIS — D6959 Other secondary thrombocytopenia: Secondary | ICD-10-CM | POA: Diagnosis present

## 2020-09-23 DIAGNOSIS — E785 Hyperlipidemia, unspecified: Secondary | ICD-10-CM | POA: Diagnosis present

## 2020-09-23 DIAGNOSIS — F10229 Alcohol dependence with intoxication, unspecified: Secondary | ICD-10-CM | POA: Diagnosis present

## 2020-09-23 DIAGNOSIS — Z79899 Other long term (current) drug therapy: Secondary | ICD-10-CM

## 2020-09-23 DIAGNOSIS — Z888 Allergy status to other drugs, medicaments and biological substances status: Secondary | ICD-10-CM

## 2020-09-23 DIAGNOSIS — F10239 Alcohol dependence with withdrawal, unspecified: Principal | ICD-10-CM | POA: Diagnosis present

## 2020-09-23 DIAGNOSIS — I10 Essential (primary) hypertension: Secondary | ICD-10-CM | POA: Diagnosis present

## 2020-09-23 DIAGNOSIS — Z20822 Contact with and (suspected) exposure to covid-19: Secondary | ICD-10-CM | POA: Diagnosis present

## 2020-09-23 DIAGNOSIS — Z7982 Long term (current) use of aspirin: Secondary | ICD-10-CM

## 2020-09-23 DIAGNOSIS — F10939 Alcohol use, unspecified with withdrawal, unspecified: Secondary | ICD-10-CM

## 2020-09-23 DIAGNOSIS — E8729 Other acidosis: Secondary | ICD-10-CM

## 2020-09-23 DIAGNOSIS — E871 Hypo-osmolality and hyponatremia: Secondary | ICD-10-CM | POA: Diagnosis present

## 2020-09-23 DIAGNOSIS — E1010 Type 1 diabetes mellitus with ketoacidosis without coma: Secondary | ICD-10-CM

## 2020-09-23 DIAGNOSIS — F419 Anxiety disorder, unspecified: Secondary | ICD-10-CM | POA: Diagnosis present

## 2020-09-23 DIAGNOSIS — F1010 Alcohol abuse, uncomplicated: Secondary | ICD-10-CM

## 2020-09-23 DIAGNOSIS — Z7984 Long term (current) use of oral hypoglycemic drugs: Secondary | ICD-10-CM

## 2020-09-23 LAB — COMPREHENSIVE METABOLIC PANEL
ALT: 58 U/L — ABNORMAL HIGH (ref 0–44)
AST: 75 U/L — ABNORMAL HIGH (ref 15–41)
Albumin: 4.6 g/dL (ref 3.5–5.0)
Alkaline Phosphatase: 81 U/L (ref 38–126)
Anion gap: 22 — ABNORMAL HIGH (ref 5–15)
BUN: 13 mg/dL (ref 6–20)
CO2: 16 mmol/L — ABNORMAL LOW (ref 22–32)
Calcium: 9 mg/dL (ref 8.9–10.3)
Chloride: 94 mmol/L — ABNORMAL LOW (ref 98–111)
Creatinine, Ser: 1.04 mg/dL (ref 0.61–1.24)
GFR, Estimated: >60 mL/min (ref >60)
Glucose, Bld: 405 mg/dL — ABNORMAL HIGH (ref 70–99)
Potassium: 4.4 mmol/L (ref 3.5–5.1)
Sodium: 132 mmol/L — ABNORMAL LOW (ref 135–145)
Total Bilirubin: 2 mg/dL — ABNORMAL HIGH (ref 0.3–1.2)
Total Protein: 7.7 g/dL (ref 6.5–8.1)

## 2020-09-23 LAB — CBC
HCT: 42.4 % (ref 39.0–52.0)
Hemoglobin: 15.4 g/dL (ref 13.0–17.0)
MCH: 34.8 pg — ABNORMAL HIGH (ref 26.0–34.0)
MCHC: 36.3 g/dL — ABNORMAL HIGH (ref 30.0–36.0)
MCV: 95.9 fL (ref 80.0–100.0)
Platelets: 120 10*3/uL — ABNORMAL LOW (ref 150–400)
RBC: 4.42 MIL/uL (ref 4.22–5.81)
RDW: 13.4 % (ref 11.5–15.5)
WBC: 6.3 10*3/uL (ref 4.0–10.5)
nRBC: 1.3 % — ABNORMAL HIGH (ref 0.0–0.2)

## 2020-09-23 LAB — URINE DRUG SCREEN, QUALITATIVE (ARMC ONLY)
Amphetamines, Ur Screen: NOT DETECTED
Barbiturates, Ur Screen: NOT DETECTED
Benzodiazepine, Ur Scrn: NOT DETECTED
Cannabinoid 50 Ng, Ur ~~LOC~~: NOT DETECTED
Cocaine Metabolite,Ur ~~LOC~~: NOT DETECTED
MDMA (Ecstasy)Ur Screen: NOT DETECTED
Methadone Scn, Ur: NOT DETECTED
Opiate, Ur Screen: NOT DETECTED
Phencyclidine (PCP) Ur S: NOT DETECTED
Tricyclic, Ur Screen: NOT DETECTED

## 2020-09-23 LAB — ETHANOL: Alcohol, Ethyl (B): 243 mg/dL — ABNORMAL HIGH (ref <10)

## 2020-09-23 MED ORDER — CHLORDIAZEPOXIDE HCL 25 MG PO CAPS
50.0000 mg | ORAL_CAPSULE | Freq: Once | ORAL | Status: AC
  Administered 2020-09-23: 50 mg via ORAL
  Filled 2020-09-23: qty 2

## 2020-09-23 MED ORDER — ONDANSETRON HCL 4 MG/2ML IJ SOLN
4.0000 mg | Freq: Once | INTRAMUSCULAR | Status: AC
  Administered 2020-09-23: 4 mg via INTRAVENOUS
  Filled 2020-09-23: qty 2

## 2020-09-23 MED ORDER — LACTATED RINGERS IV BOLUS
1000.0000 mL | Freq: Once | INTRAVENOUS | Status: AC
  Administered 2020-09-23: 1000 mL via INTRAVENOUS

## 2020-09-23 MED ORDER — FAMOTIDINE IN NACL 20-0.9 MG/50ML-% IV SOLN
20.0000 mg | Freq: Once | INTRAVENOUS | Status: AC
  Administered 2020-09-23: 20 mg via INTRAVENOUS
  Filled 2020-09-23: qty 50

## 2020-09-23 MED ORDER — ALUM & MAG HYDROXIDE-SIMETH 200-200-20 MG/5ML PO SUSP
30.0000 mL | Freq: Once | ORAL | Status: AC
  Administered 2020-09-23: 30 mL via ORAL
  Filled 2020-09-23: qty 30

## 2020-09-23 MED ORDER — THIAMINE HCL 100 MG/ML IJ SOLN
100.0000 mg | Freq: Once | INTRAMUSCULAR | Status: AC
  Administered 2020-09-23: 100 mg via INTRAVENOUS
  Filled 2020-09-23: qty 2

## 2020-09-23 MED ORDER — FOLIC ACID 1 MG PO TABS
1.0000 mg | ORAL_TABLET | Freq: Once | ORAL | Status: AC
  Administered 2020-09-23: 1 mg via ORAL
  Filled 2020-09-23: qty 1

## 2020-09-23 NOTE — ED Triage Notes (Signed)
Pt in with co wanting alcohol detox, states normally drinks 1/5 of liquor daily. Has not had any alcohol in over 12 hrs. Pt shaky in triage and vomiting.

## 2020-09-24 DIAGNOSIS — D6959 Other secondary thrombocytopenia: Secondary | ICD-10-CM | POA: Diagnosis present

## 2020-09-24 DIAGNOSIS — E785 Hyperlipidemia, unspecified: Secondary | ICD-10-CM | POA: Diagnosis present

## 2020-09-24 DIAGNOSIS — E871 Hypo-osmolality and hyponatremia: Secondary | ICD-10-CM | POA: Diagnosis present

## 2020-09-24 DIAGNOSIS — Z888 Allergy status to other drugs, medicaments and biological substances status: Secondary | ICD-10-CM | POA: Diagnosis not present

## 2020-09-24 DIAGNOSIS — Z7982 Long term (current) use of aspirin: Secondary | ICD-10-CM | POA: Diagnosis not present

## 2020-09-24 DIAGNOSIS — Y908 Blood alcohol level of 240 mg/100 ml or more: Secondary | ICD-10-CM | POA: Diagnosis present

## 2020-09-24 DIAGNOSIS — K219 Gastro-esophageal reflux disease without esophagitis: Secondary | ICD-10-CM

## 2020-09-24 DIAGNOSIS — Z7984 Long term (current) use of oral hypoglycemic drugs: Secondary | ICD-10-CM | POA: Diagnosis not present

## 2020-09-24 DIAGNOSIS — Z87891 Personal history of nicotine dependence: Secondary | ICD-10-CM | POA: Diagnosis not present

## 2020-09-24 DIAGNOSIS — F1010 Alcohol abuse, uncomplicated: Secondary | ICD-10-CM

## 2020-09-24 DIAGNOSIS — I10 Essential (primary) hypertension: Secondary | ICD-10-CM | POA: Diagnosis present

## 2020-09-24 DIAGNOSIS — F10239 Alcohol dependence with withdrawal, unspecified: Secondary | ICD-10-CM | POA: Diagnosis present

## 2020-09-24 DIAGNOSIS — F419 Anxiety disorder, unspecified: Secondary | ICD-10-CM | POA: Diagnosis present

## 2020-09-24 DIAGNOSIS — F332 Major depressive disorder, recurrent severe without psychotic features: Secondary | ICD-10-CM

## 2020-09-24 DIAGNOSIS — E1310 Other specified diabetes mellitus with ketoacidosis without coma: Secondary | ICD-10-CM

## 2020-09-24 DIAGNOSIS — E1110 Type 2 diabetes mellitus with ketoacidosis without coma: Secondary | ICD-10-CM | POA: Diagnosis present

## 2020-09-24 DIAGNOSIS — Z79899 Other long term (current) drug therapy: Secondary | ICD-10-CM | POA: Diagnosis not present

## 2020-09-24 DIAGNOSIS — Z20822 Contact with and (suspected) exposure to covid-19: Secondary | ICD-10-CM | POA: Diagnosis present

## 2020-09-24 DIAGNOSIS — Z79890 Hormone replacement therapy (postmenopausal): Secondary | ICD-10-CM | POA: Diagnosis not present

## 2020-09-24 DIAGNOSIS — F10939 Alcohol use, unspecified with withdrawal, unspecified: Secondary | ICD-10-CM

## 2020-09-24 DIAGNOSIS — Z91041 Radiographic dye allergy status: Secondary | ICD-10-CM | POA: Diagnosis not present

## 2020-09-24 DIAGNOSIS — Z794 Long term (current) use of insulin: Secondary | ICD-10-CM | POA: Diagnosis not present

## 2020-09-24 DIAGNOSIS — R569 Unspecified convulsions: Secondary | ICD-10-CM

## 2020-09-24 DIAGNOSIS — E878 Other disorders of electrolyte and fluid balance, not elsewhere classified: Secondary | ICD-10-CM | POA: Diagnosis present

## 2020-09-24 DIAGNOSIS — F10229 Alcohol dependence with intoxication, unspecified: Secondary | ICD-10-CM | POA: Diagnosis present

## 2020-09-24 LAB — CBC
HCT: 33.7 % — ABNORMAL LOW (ref 39.0–52.0)
Hemoglobin: 12.6 g/dL — ABNORMAL LOW (ref 13.0–17.0)
MCH: 34.9 pg — ABNORMAL HIGH (ref 26.0–34.0)
MCHC: 37.4 g/dL — ABNORMAL HIGH (ref 30.0–36.0)
MCV: 93.4 fL (ref 80.0–100.0)
Platelets: 104 10*3/uL — ABNORMAL LOW (ref 150–400)
RBC: 3.61 MIL/uL — ABNORMAL LOW (ref 4.22–5.81)
RDW: 13.5 % (ref 11.5–15.5)
WBC: 6.9 10*3/uL (ref 4.0–10.5)
nRBC: 0.6 % — ABNORMAL HIGH (ref 0.0–0.2)

## 2020-09-24 LAB — URINALYSIS, COMPLETE (UACMP) WITH MICROSCOPIC
Bacteria, UA: NONE SEEN
Bilirubin Urine: NEGATIVE
Glucose, UA: >=500 mg/dL — AB
Ketones, ur: 80 mg/dL — AB
Leukocytes,Ua: NEGATIVE
Nitrite: NEGATIVE
Protein, ur: 100 mg/dL — AB
Specific Gravity, Urine: 1.032 — ABNORMAL HIGH (ref 1.005–1.030)
Squamous Epithelial / HPF: NONE SEEN (ref 0–5)
pH: 6 (ref 5.0–8.0)

## 2020-09-24 LAB — BASIC METABOLIC PANEL
Anion gap: 20 — ABNORMAL HIGH (ref 5–15)
Anion gap: 17 — ABNORMAL HIGH (ref 5–15)
Anion gap: 13 (ref 5–15)
Anion gap: 16 — ABNORMAL HIGH (ref 5–15)
Anion gap: 11 (ref 5–15)
BUN: 13 mg/dL (ref 6–20)
BUN: 12 mg/dL (ref 6–20)
BUN: 12 mg/dL (ref 6–20)
BUN: 13 mg/dL (ref 6–20)
BUN: 11 mg/dL (ref 6–20)
CO2: 16 mmol/L — ABNORMAL LOW (ref 22–32)
CO2: 18 mmol/L — ABNORMAL LOW (ref 22–32)
CO2: 23 mmol/L (ref 22–32)
CO2: 24 mmol/L (ref 22–32)
CO2: 24 mmol/L (ref 22–32)
Calcium: 8.6 mg/dL — ABNORMAL LOW (ref 8.9–10.3)
Calcium: 8.5 mg/dL — ABNORMAL LOW (ref 8.9–10.3)
Calcium: 8.5 mg/dL — ABNORMAL LOW (ref 8.9–10.3)
Calcium: 9.1 mg/dL (ref 8.9–10.3)
Calcium: 8.4 mg/dL — ABNORMAL LOW (ref 8.9–10.3)
Chloride: 93 mmol/L — ABNORMAL LOW (ref 98–111)
Chloride: 96 mmol/L — ABNORMAL LOW (ref 98–111)
Chloride: 96 mmol/L — ABNORMAL LOW (ref 98–111)
Chloride: 95 mmol/L — ABNORMAL LOW (ref 98–111)
Chloride: 95 mmol/L — ABNORMAL LOW (ref 98–111)
Creatinine, Ser: 0.78 mg/dL (ref 0.61–1.24)
Creatinine, Ser: 0.67 mg/dL (ref 0.61–1.24)
Creatinine, Ser: 0.79 mg/dL (ref 0.61–1.24)
Creatinine, Ser: 0.97 mg/dL (ref 0.61–1.24)
Creatinine, Ser: 0.79 mg/dL (ref 0.61–1.24)
GFR, Estimated: >60 mL/min (ref >60)
GFR, Estimated: >60 mL/min (ref >60)
GFR, Estimated: >60 mL/min (ref >60)
GFR, Estimated: >60 mL/min (ref >60)
GFR, Estimated: >60 mL/min (ref >60)
Glucose, Bld: 322 mg/dL — ABNORMAL HIGH (ref 70–99)
Glucose, Bld: 170 mg/dL — ABNORMAL HIGH (ref 70–99)
Glucose, Bld: 160 mg/dL — ABNORMAL HIGH (ref 70–99)
Glucose, Bld: 75 mg/dL (ref 70–99)
Glucose, Bld: 213 mg/dL — ABNORMAL HIGH (ref 70–99)
Potassium: 4.2 mmol/L (ref 3.5–5.1)
Potassium: 4 mmol/L (ref 3.5–5.1)
Potassium: 3.7 mmol/L (ref 3.5–5.1)
Potassium: 3.4 mmol/L — ABNORMAL LOW (ref 3.5–5.1)
Potassium: 3.2 mmol/L — ABNORMAL LOW (ref 3.5–5.1)
Sodium: 129 mmol/L — ABNORMAL LOW (ref 135–145)
Sodium: 131 mmol/L — ABNORMAL LOW (ref 135–145)
Sodium: 132 mmol/L — ABNORMAL LOW (ref 135–145)
Sodium: 135 mmol/L (ref 135–145)
Sodium: 130 mmol/L — ABNORMAL LOW (ref 135–145)

## 2020-09-24 LAB — URINALYSIS, ROUTINE W REFLEX MICROSCOPIC
Bacteria, UA: NONE SEEN
Bilirubin Urine: NEGATIVE
Glucose, UA: >=500 mg/dL — AB
Ketones, ur: 80 mg/dL — AB
Leukocytes,Ua: NEGATIVE
Nitrite: NEGATIVE
Protein, ur: 100 mg/dL — AB
Specific Gravity, Urine: 1.028 (ref 1.005–1.030)
Squamous Epithelial / HPF: NONE SEEN (ref 0–5)
pH: 5 (ref 5.0–8.0)

## 2020-09-24 LAB — RESP PANEL BY RT-PCR (FLU A&B, COVID) ARPGX2
Influenza A by PCR: NEGATIVE
Influenza B by PCR: NEGATIVE
SARS Coronavirus 2 by RT PCR: NEGATIVE

## 2020-09-24 LAB — CBG MONITORING, ED
Glucose-Capillary: 335 mg/dL — ABNORMAL HIGH (ref 70–99)
Glucose-Capillary: 164 mg/dL — ABNORMAL HIGH (ref 70–99)
Glucose-Capillary: 244 mg/dL — ABNORMAL HIGH (ref 70–99)
Glucose-Capillary: 104 mg/dL — ABNORMAL HIGH (ref 70–99)
Glucose-Capillary: 106 mg/dL — ABNORMAL HIGH (ref 70–99)
Glucose-Capillary: 177 mg/dL — ABNORMAL HIGH (ref 70–99)
Glucose-Capillary: 196 mg/dL — ABNORMAL HIGH (ref 70–99)
Glucose-Capillary: 207 mg/dL — ABNORMAL HIGH (ref 70–99)

## 2020-09-24 LAB — LIPASE, BLOOD: Lipase: 33 U/L (ref 11–51)

## 2020-09-24 LAB — GLUCOSE, CAPILLARY
Glucose-Capillary: 143 mg/dL — ABNORMAL HIGH (ref 70–99)
Glucose-Capillary: 148 mg/dL — ABNORMAL HIGH (ref 70–99)
Glucose-Capillary: 147 mg/dL — ABNORMAL HIGH (ref 70–99)
Glucose-Capillary: 121 mg/dL — ABNORMAL HIGH (ref 70–99)
Glucose-Capillary: 259 mg/dL — ABNORMAL HIGH (ref 70–99)

## 2020-09-24 LAB — BETA-HYDROXYBUTYRIC ACID
Beta-Hydroxybutyric Acid: 3.24 mmol/L — ABNORMAL HIGH (ref 0.05–0.27)
Beta-Hydroxybutyric Acid: 3.59 mmol/L — ABNORMAL HIGH (ref 0.05–0.27)

## 2020-09-24 LAB — HIV ANTIBODY (ROUTINE TESTING W REFLEX): HIV Screen 4th Generation wRfx: NONREACTIVE

## 2020-09-24 MED ORDER — SIMVASTATIN 20 MG PO TABS
20.0000 mg | ORAL_TABLET | Freq: Every day | ORAL | Status: DC
  Administered 2020-09-24 – 2020-09-27 (×4): 20 mg via ORAL
  Filled 2020-09-24: qty 1
  Filled 2020-09-24: qty 2
  Filled 2020-09-24 (×2): qty 1

## 2020-09-24 MED ORDER — MAGNESIUM HYDROXIDE 400 MG/5ML PO SUSP: 30.0000 mL | Freq: Every day | ORAL | Status: DC | PRN

## 2020-09-24 MED ORDER — INSULIN REGULAR(HUMAN) IN NACL 100-0.9 UT/100ML-% IV SOLN
INTRAVENOUS | Status: DC
  Administered 2020-09-24: 19 [IU]/h via INTRAVENOUS
  Filled 2020-09-24: qty 100

## 2020-09-24 MED ORDER — POTASSIUM CHLORIDE 10 MEQ/100ML IV SOLN: 10.0000 meq | INTRAVENOUS | Status: DC

## 2020-09-24 MED ORDER — LACTATED RINGERS IV SOLN: INTRAVENOUS | Status: DC

## 2020-09-24 MED ORDER — DEXTROSE 50 % IV SOLN: 0.0000 mL | INTRAVENOUS | Status: DC | PRN

## 2020-09-24 MED ORDER — ENOXAPARIN SODIUM 40 MG/0.4ML IJ SOSY
40.0000 mg | PREFILLED_SYRINGE | INTRAMUSCULAR | Status: DC
  Administered 2020-09-24 – 2020-09-26 (×3): 40 mg via SUBCUTANEOUS
  Filled 2020-09-24 (×2): qty 0.4

## 2020-09-24 MED ORDER — ATENOLOL 25 MG PO TABS
50.0000 mg | ORAL_TABLET | Freq: Every day | ORAL | Status: DC
  Administered 2020-09-24 – 2020-09-27 (×4): 50 mg via ORAL
  Filled 2020-09-24 (×4): qty 2

## 2020-09-24 MED ORDER — POTASSIUM CHLORIDE CRYS ER 20 MEQ PO TBCR
40.0000 meq | EXTENDED_RELEASE_TABLET | Freq: Once | ORAL | Status: AC
  Administered 2020-09-24: 40 meq via ORAL
  Filled 2020-09-24: qty 2

## 2020-09-24 MED ORDER — ASPIRIN EC 81 MG PO TBEC
81.0000 mg | DELAYED_RELEASE_TABLET | Freq: Two times a day (BID) | ORAL | Status: DC
  Administered 2020-09-24 – 2020-09-27 (×7): 81 mg via ORAL
  Filled 2020-09-24 (×7): qty 1

## 2020-09-24 MED ORDER — PRENATAL MULTIVITAMIN CH: 1.0000 | ORAL_TABLET | Freq: Every day | ORAL | Status: DC

## 2020-09-24 MED ORDER — DIAZEPAM 5 MG/ML IJ SOLN
2.5000 mg | INTRAMUSCULAR | Status: DC | PRN
  Administered 2020-09-24 – 2020-09-27 (×15): 5 mg via INTRAVENOUS
  Filled 2020-09-24 (×15): qty 2

## 2020-09-24 MED ORDER — VITAMIN D (ERGOCALCIFEROL) 1.25 MG (50000 UNIT) PO CAPS: 50000.0000 [IU] | ORAL_CAPSULE | ORAL | Status: DC

## 2020-09-24 MED ORDER — INSULIN GLARGINE 100 UNIT/ML ~~LOC~~ SOLN
20.0000 [IU] | Freq: Once | SUBCUTANEOUS | Status: AC
  Administered 2020-09-24: 20 [IU] via SUBCUTANEOUS
  Filled 2020-09-24: qty 0.2

## 2020-09-24 MED ORDER — PAROXETINE HCL 20 MG PO TABS
40.0000 mg | ORAL_TABLET | Freq: Every day | ORAL | Status: DC
  Administered 2020-09-24: 40 mg via ORAL
  Filled 2020-09-24 (×2): qty 2

## 2020-09-24 MED ORDER — ACETAMINOPHEN 325 MG PO TABS
650.0000 mg | ORAL_TABLET | Freq: Four times a day (QID) | ORAL | Status: DC | PRN
  Administered 2020-09-26 – 2020-09-27 (×4): 650 mg via ORAL
  Filled 2020-09-24 (×4): qty 2

## 2020-09-24 MED ORDER — DOXEPIN HCL 25 MG PO CAPS
25.0000 mg | ORAL_CAPSULE | Freq: Every day | ORAL | Status: DC
  Administered 2020-09-24 – 2020-09-26 (×2): 25 mg via ORAL
  Filled 2020-09-24 (×6): qty 1

## 2020-09-24 MED ORDER — CHLORDIAZEPOXIDE HCL 25 MG PO CAPS
25.0000 mg | ORAL_CAPSULE | Freq: Three times a day (TID) | ORAL | Status: DC
  Administered 2020-09-24 – 2020-09-27 (×10): 25 mg via ORAL
  Filled 2020-09-24 (×11): qty 1

## 2020-09-24 MED ORDER — BUPROPION HCL ER (XL) 300 MG PO TB24
300.0000 mg | ORAL_TABLET | Freq: Every day | ORAL | Status: DC
  Administered 2020-09-24: 300 mg via ORAL
  Filled 2020-09-24: qty 2

## 2020-09-24 MED ORDER — INSULIN ASPART 100 UNIT/ML IJ SOLN
0.0000 [IU] | Freq: Three times a day (TID) | INTRAMUSCULAR | Status: DC
Start: 2020-09-24 — End: 2020-09-26
  Administered 2020-09-25: 5 [IU] via SUBCUTANEOUS
  Administered 2020-09-25 (×2): 11 [IU] via SUBCUTANEOUS
  Administered 2020-09-26: 8 [IU] via SUBCUTANEOUS
  Administered 2020-09-26: 11 [IU] via SUBCUTANEOUS
  Administered 2020-09-26: 8 [IU] via SUBCUTANEOUS
  Filled 2020-09-24 (×6): qty 1

## 2020-09-24 MED ORDER — POTASSIUM CHLORIDE 10 MEQ/100ML IV SOLN
10.0000 meq | INTRAVENOUS | Status: AC
  Administered 2020-09-24 (×2): 10 meq via INTRAVENOUS
  Filled 2020-09-24 (×2): qty 100

## 2020-09-24 MED ORDER — AMLODIPINE BESYLATE 10 MG PO TABS
10.0000 mg | ORAL_TABLET | Freq: Every day | ORAL | Status: DC
  Administered 2020-09-24 – 2020-09-27 (×4): 10 mg via ORAL
  Filled 2020-09-24: qty 2
  Filled 2020-09-24 (×3): qty 1

## 2020-09-24 MED ORDER — INSULIN GLARGINE 100 UNIT/ML ~~LOC~~ SOLN
20.0000 [IU] | Freq: Every day | SUBCUTANEOUS | Status: DC
  Filled 2020-09-24: qty 0.2

## 2020-09-24 MED ORDER — CHLORHEXIDINE GLUCONATE CLOTH 2 % EX PADS
6.0000 | MEDICATED_PAD | Freq: Every day | CUTANEOUS | Status: DC
  Administered 2020-09-24 – 2020-09-26 (×2): 6 via TOPICAL

## 2020-09-24 MED ORDER — DEXTROSE IN LACTATED RINGERS 5 % IV SOLN: INTRAVENOUS | Status: DC

## 2020-09-24 MED ORDER — ONDANSETRON HCL 4 MG/2ML IJ SOLN
4.0000 mg | Freq: Four times a day (QID) | INTRAMUSCULAR | Status: DC | PRN
  Administered 2020-09-24 – 2020-09-26 (×5): 4 mg via INTRAVENOUS
  Filled 2020-09-24 (×5): qty 2

## 2020-09-24 MED ORDER — INSULIN ASPART 100 UNIT/ML IJ SOLN
0.0000 [IU] | Freq: Every day | INTRAMUSCULAR | Status: DC
Start: 2020-09-24 — End: 2020-09-27
  Administered 2020-09-24 – 2020-09-26 (×3): 3 [IU] via SUBCUTANEOUS
  Filled 2020-09-24 (×2): qty 1

## 2020-09-24 MED ORDER — ADULT MULTIVITAMIN W/MINERALS CH
1.0000 | ORAL_TABLET | Freq: Every day | ORAL | Status: DC
  Administered 2020-09-24 – 2020-09-27 (×4): 1 via ORAL
  Filled 2020-09-24 (×4): qty 1

## 2020-09-24 MED ORDER — DIAZEPAM 5 MG/ML IJ SOLN
5.0000 mg | Freq: Once | INTRAMUSCULAR | Status: AC
  Administered 2020-09-24: 5 mg via INTRAVENOUS
  Filled 2020-09-24: qty 2

## 2020-09-24 MED ORDER — LACTATED RINGERS IV BOLUS
1000.0000 mL | Freq: Once | INTRAVENOUS | Status: AC
  Administered 2020-09-24: 1000 mL via INTRAVENOUS

## 2020-09-24 MED ORDER — TRAZODONE HCL 50 MG PO TABS
25.0000 mg | ORAL_TABLET | Freq: Every evening | ORAL | Status: DC | PRN
  Administered 2020-09-24 – 2020-09-26 (×2): 25 mg via ORAL
  Filled 2020-09-24 (×2): qty 1

## 2020-09-24 MED ORDER — FOLIC ACID 1 MG PO TABS
1.0000 mg | ORAL_TABLET | Freq: Every day | ORAL | Status: DC
  Administered 2020-09-24 – 2020-09-27 (×4): 1 mg via ORAL
  Filled 2020-09-24 (×4): qty 1

## 2020-09-24 MED ORDER — DEXTROSE 50 % IV SOLN
0.0000 mL | INTRAVENOUS | Status: DC | PRN
Start: 2020-09-24 — End: 2020-09-27

## 2020-09-24 MED ORDER — SODIUM CHLORIDE 0.9 % BOLUS PEDS: 1000.0000 mL | Freq: Once | INTRAVENOUS | Status: DC

## 2020-09-24 MED ORDER — ACETAMINOPHEN 650 MG RE SUPP: 650.0000 mg | Freq: Four times a day (QID) | RECTAL | Status: DC | PRN

## 2020-09-24 MED ORDER — PANTOPRAZOLE SODIUM 40 MG PO TBEC
40.0000 mg | DELAYED_RELEASE_TABLET | Freq: Every day | ORAL | Status: DC
  Administered 2020-09-24 – 2020-09-27 (×4): 40 mg via ORAL
  Filled 2020-09-24 (×4): qty 1

## 2020-09-24 MED ORDER — ONDANSETRON HCL 4 MG PO TABS: 4.0000 mg | ORAL_TABLET | Freq: Four times a day (QID) | ORAL | Status: DC | PRN

## 2020-09-24 MED ORDER — PAROXETINE HCL 20 MG PO TABS
20.0000 mg | ORAL_TABLET | Freq: Every day | ORAL | Status: DC
  Administered 2020-09-25 – 2020-09-27 (×3): 20 mg via ORAL
  Filled 2020-09-24 (×3): qty 1

## 2020-09-24 MED ORDER — THIAMINE HCL 100 MG PO TABS
100.0000 mg | ORAL_TABLET | Freq: Every day | ORAL | Status: DC
  Administered 2020-09-24 – 2020-09-27 (×4): 100 mg via ORAL
  Filled 2020-09-24 (×4): qty 1

## 2020-09-24 MED ORDER — INSULIN REGULAR(HUMAN) IN NACL 100-0.9 UT/100ML-% IV SOLN
INTRAVENOUS | Status: DC
  Administered 2020-09-24: 4.8 [IU]/h via INTRAVENOUS
  Administered 2020-09-24: 1 [IU]/h via INTRAVENOUS
  Filled 2020-09-24: qty 100

## 2020-09-24 MED ORDER — LACTATED RINGERS IV BOLUS: 20.0000 mL/kg | Freq: Once | INTRAVENOUS | Status: DC

## 2020-09-24 NOTE — Progress Notes (Signed)
Inpatient Diabetes Program Recommendations  AACE/ADA: New Consensus Statement on Inpatient Glycemic Control (2015)  Target Ranges:  Prepandial:   less than 140 mg/dL      Peak postprandial:   less than 180 mg/dL (1-2 hours)      Critically ill patients:  140 - 180 mg/dL  Results for KEYLER, HOGE (MRN [id number]) as of 09/24/2020 06:53  Ref. Range 09/23/2020 23:13  Sodium Latest Ref Range: 135 - 145 mmol/L 132 (L)  Potassium Latest Ref Range: 3.5 - 5.1 mmol/L 4.4  Chloride Latest Ref Range: 98 - 111 mmol/L 94 (L)  CO2 Latest Ref Range: 22 - 32 mmol/L 16 (L)  Glucose Latest Ref Range: 70 - 99 mg/dL 405 (H)  BUN Latest Ref Range: 6 - 20 mg/dL 13  Creatinine Latest Ref Range: 0.61 - 1.24 mg/dL 1.04  Calcium Latest Ref Range: 8.9 - 10.3 mg/dL 9.0  Anion gap Latest Ref Range: 5 - 15  22 (H)   Results for FRANKO, HILLIKER (MRN [id number]) as of 09/24/2020 06:53  Ref. Range 09/24/2020 01:36 09/24/2020 02:52 09/24/2020 04:21 09/24/2020 06:28  Glucose-Capillary Latest Ref Range: 70 - 99 mg/dL 335 (H)  IV Insulin Drip Started 164 (H)  IV Insulin Drip 244 (H)  IV Insulin Drip 104 (H)  IV Insulin Drip   Admit with: Alcohol withdrawal requesting detox/ Diabetic ketoacidosis   History: DM2  Home DM Meds: Glipizide 10 mg Daily       Lantus 20 units Daily       Metformin 500 mg BID  Current Orders: IV Insulin Drip    Current A1c in process--Last A1c found in PCP notes was 11.2% back in January 2022--Lantus dose was increased to 20 units Daily at that visit  [facility] [place]    --Would leave patient on the IV Insulin Drip until Anion Gap 12 or less and CO2 level 20 or higher.    --To keep the IV Insulin Drip running, may need to increase the rate of of the D5% Lactated ringers IVF  --When transition criteria met, please make sure to Start the following: 1. Lantus 20 units Daily (make sure 1st dose of Lantus on board at least 2 hours prior to d/c of the  IV Insulin Drip) 2. Novolog Moderate Correction Scale/ SSI (0-15 units) TID AC + HS    --Will follow patient during hospitalization--  Wyn Quaker RN, MSN, CDE Diabetes Coordinator Inpatient Glycemic Control Team Team Pager: [phone] (8a-5p)
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
## 2020-09-24 NOTE — BH Assessment (Signed)
Comprehensive Clinical Assessment (CCA) Note  09/24/2020 Jacob Frederick [id number]  Jacob Frederick, 55 year old male who presents to [facility] ED voluntarily for treatment. Per triage note, Pt in with co wanting alcohol detox, states normally drinks 1/5 of liquor daily. Has not had any alcohol in over 12 hrs. Pt shaky in triage and vomiting.   During TTS assessment pt presents alert and oriented x 4, anxious but cooperative, and mood-congruent with affect. The pt does not appear to be responding to internal or external stimuli. Neither is the pt presenting with any delusional thinking. Pt verified the information provided to triage RN.   Pt identifies his main complaint to be that he is an alcoholic. Patient states that in 2018 he had several negative experiences which lead to the start of his drinking habit. Patient reports he has been going non stop since. Patient reports he is feeling dizzy with blurry vision. 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 Patient reports he is not currently seeing a therapist for OPT tx. Patient states he is also a diabetic and has not been compliant with his medications. Pt denies SI/HI/AH/VH.    Pending Provider disposition.    Chief Complaint:  Chief Complaint  Patient presents with  . Withdrawal   Visit Diagnosis: Alcohol Use Disorder    CCA Screening, Triage and Referral (STR)  Patient Reported Information How did you hear about [place]? Self  Referral name: No data recorded Referral phone number: No data recorded  Whom do you see for routine medical problems? No data recorded Practice/Facility Name: No data recorded Practice/Facility Phone Number: No data recorded Name of Contact: No data recorded Contact Number: No data recorded Contact Fax Number: No data recorded Prescriber Name: No data recorded Prescriber Address (if known): No data recorded  What Is the Reason for Your Visit/Call Today?  Patient reports he is feeling well and wants detox for alcohol.  How Long Has This Been Causing You Problems? > than 6 months  What Do You Feel Would Help You the Most Today? Alcohol or Drug Use Treatment; Medication(s)   Have You Recently Been in Any Inpatient Treatment (Hospital/Detox/Crisis Center/28-Day Program)? Yes  Name/Location of Program/Hospital:[facility] [place], [place]  How Long Were You There? No data recorded When Were You Discharged? No data recorded  Have You Ever Received Services From [facility] Before? Yes  Who Do You See at [facility]? Medical   Have You Recently Had Any Thoughts About Hurting Yourself? No  Are You Planning to Commit Suicide/Harm Yourself At This time? No   Have you Recently Had Thoughts About Hurting Someone Karolee Ohs? No  Explanation: No data recorded  Have You Used Any Alcohol or Drugs in the Past 24 Hours? Yes  How Long Ago Did You Use Drugs or Alcohol? No data recorded What Did You Use and How Much? 1/5 daily   Do You Currently Have a Therapist/Psychiatrist? No data recorded Name of Therapist/Psychiatrist: No data recorded  Have You Been Recently Discharged From Any Office Practice or Programs? No  Explanation of Discharge From Practice/Program: No data recorded    CCA Screening Triage Referral Assessment Type of Contact: Face-to-Face  Is this Initial or Reassessment? No data recorded Date Telepsych consult ordered in [facility]:  No data recorded Time Telepsych consult ordered in [facility]:  No data recorded  Patient Reported Information Reviewed? Yes  Patient Left Without Being Seen? No data recorded Reason  for Not Completing Assessment: No data recorded  Collateral Involvement: None provided   Does Patient Have a Court Appointed Legal Guardian? No data recorded Name and Contact of Legal Guardian: No data recorded If Minor and Not Living with Parent(s), Who has Custody? n/a  Is CPS involved or ever been involved? Never  Is APS involved  or ever been involved? Never   Patient Determined To Be At Risk for Harm To Self or Others Based on Review of Patient Reported Information or Presenting Complaint? No  Method: No data recorded Availability of Means: No data recorded Intent: No data recorded Notification Required: No data recorded Additional Information for Danger to Others Potential: No data recorded Additional Comments for Danger to Others Potential: No data recorded Are There Guns or Other Weapons in Your Home? No data recorded Types of Guns/Weapons: No data recorded Are These Weapons Safely Secured?                            No data recorded Who Could Verify You Are Able To Have These Secured: No data recorded Do You Have any Outstanding Charges, Pending Court Dates, Parole/Probation? No data recorded Contacted To Inform of Risk of Harm To Self or Others: No data recorded  Location of Assessment: [facility]   Does Patient Present under Involuntary Commitment? No  IVC Papers Initial File Date: No data recorded  [place] of Residence: Jacob Frederick   Patient Currently Receiving the Following Services: Not Receiving Services   Determination of Need: Urgent (48 hours)   Options For Referral: Inpatient Hospitalization; Medication Management   Recommendations for Services/Supports/Treatments:    DSM5 Diagnoses: There are no problems to display for this patient.   Patient Centered Plan: Patient is on the following Treatment Plan(s):  Substance Abuse   Referrals to Alternative Service(s): Referred to Alternative Service(s):   Place:   Date:   Time:    Referred to Alternative Service(s):   Place:   Date:   Time:    Referred to Alternative Service(s):   Place:   Date:   Time:    Referred to Alternative Service(s):   Place:   Date:   Time:     Annamary Buschman Dierdre Searles, Counselor, LCAS-A

## 2020-09-24 NOTE — ED Notes (Signed)
cbg [id number] 

## 2020-09-24 NOTE — BH Assessment (Signed)
Per MD, patient will more than likely be moved to medical floor given the results from patient's blood work.

## 2020-09-24 NOTE — Progress Notes (Signed)
No charge progress note.  Jacob Frederick is a 55 y.o. Caucasian male with medical history significant for type 2 diabetes mellitus, hypertension, Lyme disease and degenerative disc disease, who presented to the ER with acute onset of alcohol withdrawal requesting detox.  The patient drinks 1/5 of vodka per day and has been doing so for the last several months.  His last detox was in October 2021 and that required a couple of days of hospitalization with high CIWA scores.  No history of seizures.  He had hallucinations before with Ativan and tolerates Librium and Valium.   Patient was found to be in DKA and started on Endo tool. Currently gap closed x1  We will start him on basal and SSI once gap closed another time.  Bicarb has been normalized. Patient was on 20 units of Lantus along with metformin and glipizide at home.  He was also started on CIWA protocol.  Patient continued to experience some nausea but no vomiting when seen today.  Abdominal pain has been improved.  He was asking about some food and started on clear liquid diet.  Psych was also consulted and they are recommending inpatient transfer to psych department in [place] as he is a [place] patient for substance abuse and detox once DKA resolved.

## 2020-09-24 NOTE — ED Notes (Signed)
Pt with episode of emesis.
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
## 2020-09-24 NOTE — Progress Notes (Signed)
Lantus 20 units administered.

## 2020-09-24 NOTE — Progress Notes (Signed)
GBG 145

## 2020-09-24 NOTE — Consult Note (Signed)
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 depression at this point.  We will follow as needed.  Patient is a veteran and the best possible substance abuse treatment he could get would be if we could refer him to a [facility] inpatient program directly from the hospital which is what I would recommend as a social work goal if possible.  Disposition: No evidence of imminent risk to self or others at present.   Supportive therapy provided about ongoing  stressors. Discussed crisis plan, support from social network, calling 911, coming to the Emergency Department, and calling Suicide Hotline.  Mordecai Rasmussen, MD 09/24/2020 1:37 PM

## 2020-09-25 LAB — GLUCOSE, CAPILLARY
Glucose-Capillary: 228 mg/dL — ABNORMAL HIGH (ref 70–99)
Glucose-Capillary: 321 mg/dL — ABNORMAL HIGH (ref 70–99)
Glucose-Capillary: 335 mg/dL — ABNORMAL HIGH (ref 70–99)
Glucose-Capillary: 331 mg/dL — ABNORMAL HIGH (ref 70–99)
Glucose-Capillary: 267 mg/dL — ABNORMAL HIGH (ref 70–99)

## 2020-09-25 LAB — CBC
HCT: 35.7 % — ABNORMAL LOW (ref 39.0–52.0)
Hemoglobin: 13 g/dL (ref 13.0–17.0)
MCH: 35.2 pg — ABNORMAL HIGH (ref 26.0–34.0)
MCHC: 36.4 g/dL — ABNORMAL HIGH (ref 30.0–36.0)
MCV: 96.7 fL (ref 80.0–100.0)
Platelets: 104 10*3/uL — ABNORMAL LOW (ref 150–400)
RBC: 3.69 MIL/uL — ABNORMAL LOW (ref 4.22–5.81)
RDW: 13.6 % (ref 11.5–15.5)
WBC: 3.9 10*3/uL — ABNORMAL LOW (ref 4.0–10.5)
nRBC: 1 % — ABNORMAL HIGH (ref 0.0–0.2)

## 2020-09-25 LAB — BASIC METABOLIC PANEL
Anion gap: 12 (ref 5–15)
BUN: 10 mg/dL (ref 6–20)
CO2: 25 mmol/L (ref 22–32)
Calcium: 9 mg/dL (ref 8.9–10.3)
Chloride: 97 mmol/L — ABNORMAL LOW (ref 98–111)
Creatinine, Ser: 0.86 mg/dL (ref 0.61–1.24)
GFR, Estimated: >60 mL/min (ref >60)
Glucose, Bld: 242 mg/dL — ABNORMAL HIGH (ref 70–99)
Potassium: 4.2 mmol/L (ref 3.5–5.1)
Sodium: 134 mmol/L — ABNORMAL LOW (ref 135–145)

## 2020-09-25 LAB — HEMOGLOBIN A1C
Hgb A1c MFr Bld: 8.9 % — ABNORMAL HIGH (ref 4.8–5.6)
Hgb A1c MFr Bld: 8.9 % — ABNORMAL HIGH (ref 4.8–5.6)
Mean Plasma Glucose: 209 mg/dL
Mean Plasma Glucose: 209 mg/dL

## 2020-09-25 MED ORDER — LOPERAMIDE HCL 2 MG PO CAPS
2.0000 mg | ORAL_CAPSULE | ORAL | Status: DC | PRN
  Administered 2020-09-25: 2 mg via ORAL
  Filled 2020-09-25: qty 1

## 2020-09-25 MED ORDER — ZINC OXIDE 40 % EX OINT
TOPICAL_OINTMENT | Freq: Two times a day (BID) | CUTANEOUS | Status: DC
  Filled 2020-09-25: qty 113

## 2020-09-25 MED ORDER — INSULIN ASPART 100 UNIT/ML IJ SOLN: 3.0000 [IU] | Freq: Three times a day (TID) | INTRAMUSCULAR | Status: DC

## 2020-09-25 MED ORDER — INSULIN GLARGINE 100 UNIT/ML ~~LOC~~ SOLN
25.0000 [IU] | Freq: Every day | SUBCUTANEOUS | Status: DC
  Administered 2020-09-25: 25 [IU] via SUBCUTANEOUS
  Filled 2020-09-25 (×2): qty 0.25

## 2020-09-25 NOTE — Progress Notes (Addendum)
Inpatient Diabetes Program Recommendations  AACE/ADA: New Consensus Statement on Inpatient Glycemic Control (2015)  Target Ranges:  Prepandial:   less than 140 mg/dL      Peak postprandial:   less than 180 mg/dL (1-2 hours)      Critically ill patients:  140 - 180 mg/dL   Results for TREA, LATNER (MRN [id number]) as of 09/25/2020 08:09  Ref. Range 09/24/2020 14:48 09/24/2020 16:03 09/24/2020 21:17 09/25/2020 07:32  Glucose-Capillary Latest Ref Range: 70 - 99 mg/dL 147 (H) 121 (H)  20 units LANTUS @16 :55 259 (H)  3 units NOVOLOG  228 (H)   Results for Jacob Frederick, Jacob Frederick (MRN [id number]) as of 09/25/2020 08:09  Ref. Range 09/24/2020 01:37  Hemoglobin A1C Latest Ref Range: 4.8 - 5.6 % 8.9 (H)     Home DM Meds: Glipizide 10 mg Daily                             Lantus 20 units Daily                             Metformin 500 mg BID  Current Orders: Lantus 20 units QHS      Novolog 0-15 units ac/hs   Transitioned off the IV Insulin drip yest around 5pm   MD- Note AM CBG 228 this AM  Please consider:  1. Increase Lantus to 25 units QHS  2. Start Novolog Meal Coverage while home oral DM meds on hold: Novolog 3 units TID with meals Hold if pt eats <50% of meal, Hold if pt NPO    Addendum 10am--Met w/ pt at bedside to discuss current A1c, DKA, DM care at home, etc.  Pt told me he knows how to take all meds and check CBGs but has not been taking meds on regular basis due to many emotional stressors, ETOH abuse.  Pt told me about all his losses over the past few years and that he started drinking heavily as a result.  Has not been taking care of himself but recognizes the need to make positive chagnes.  Has CBG meter at home.    Reviewed A1c from January at PCP office visit and current A1c of 8.9%.   Reminded patient that his goal A1c is 7% or less per ADA standards to prevent both acute and long-term complications.  Explained to patient the extreme importance of good glucose control at home.   Encouraged patient to check his CBGs at least bid at home (fasting and another check within the day) and to record all CBGs in a logbook for his PCP to review.  Also discussed with patient diagnosis of DKA (pathophysiology), treatment of DKA, lab results, and transition plan to SQ insulin regimen.  Explained to pt that we have transitioned him to Lantus and Novolog and explained what Novolog is and how we are dosing it.  Also explained why home oral DM meds have not been resumed yet.  Pt appreciative of visit and did not have diabetes questions for me at this time.      --Will follow patient during hospitalization--  Wyn Quaker RN, MSN, CDE Diabetes Coordinator Inpatient Glycemic Control Team Team Pager: [phone] (8a-5p)

## 2020-09-25 NOTE — Progress Notes (Addendum)
Pt is complaining of a sore bottom from using the bathroom. MD Mansy made aware. Will continue to monitor.  Update 2001: MD Mansy ordered imodium 2 mg capsule as needed for diarrhea or loose stools and Desitin ointment topical twice a day. Will continue to monitor.

## 2020-09-25 NOTE — Progress Notes (Signed)
PROGRESS NOTE    Jacob Frederick  OJJ:[id number] DOB: [birth year] DOA: 09/23/2020 PCP: [facility]    Brief Narrative:  Jacob Frederick is a 55 y.o. Caucasian male with medical history significant for type 2 diabetes mellitus, hypertension, Lyme disease and degenerative disc disease, who presented to the ER with acute onset of alcohol withdrawal requesting detox. Upon presentation to the ER, blood pressure was 142/81 with a heart rate of 146 with otherwise normal vital signs.  Labs revealed venous blood gas with a pH 7.36, PCO2 33, PO2 48 and HCO3 18.6 with O2 sat of 81.6.  BMP revealed hyponatremia 129 hypochloremia of 93, hyperglycemia 322 with a calcium of 8.6.  Anion gap was 20.  Beta hydroxybutyrate 3.24.  Influenza antigens and COVID-19 PCR came back negative.    6/8 Per nursing he scored 4 and received this AM.  Last night he also received Valium.  Consultants:   Psychiatry  Procedures:   Antimicrobials:       Subjective: Patient denies chest pain, abdominal pain, nausea vomiting  Objective: Vitals:   09/25/20 1200 09/25/20 1221 09/25/20 1506 09/25/20 1635  BP: 117/72  113/81 125/77  Pulse: 72  77 70  Resp: 14  18 16   Temp:  98.5 F (36.9 C) 99.5 F (37.5 C) 98.7 F (37.1 C)  TempSrc:  Oral Oral   SpO2: 95%  97% 99%  Weight:      Height:        Intake/Output Summary (Last 24 hours) at 09/25/2020 1645 Last data filed at 09/25/2020 1414 Gross per 24 hour  Intake 600 ml  Output --  Net 600 ml   Filed Weights   09/23/20 2258  Weight: 99.8 kg    Examination:  General exam: Appears calm and comfortable  Respiratory system: Clear to auscultation. Respiratory effort normal. Cardiovascular system: S1 & S2 heard, RRR. No JVD, murmurs, rubs, gallops or clicks.  Gastrointestinal system: Abdomen is nondistended, soft and nontender. Normal bowel sounds heard. Central nervous system: Alert and oriented.  Grossly intact Extremities: No edema Skin: Warm  dry Psychiatry: Judgement and insight appear normal. Mood & affect appropriate.     Data Reviewed: I have personally reviewed following labs and imaging studies  CBC: Recent Labs  Lab 09/23/20 2313 09/24/20 0939 09/25/20 0924  WBC 6.3 6.9 3.9*  HGB 15.4 12.6* 13.0  HCT 42.4 33.7* 35.7*  MCV 95.9 93.4 96.7  PLT 120* 104* 104*   Basic Metabolic Panel: Recent Labs  Lab 09/24/20 0426 09/24/20 0930 09/24/20 1251 09/24/20 1727 09/25/20 0924  NA 131* 132* 135 130* 134*  K 4.0 3.7 3.4* 3.2* 4.2  CL 96* 96* 95* 95* 97*  CO2 18* 23 24 24 25   GLUCOSE 170* 160* 75 213* 242*  BUN 12 12 13 11 10   CREATININE 0.67 0.79 0.97 0.79 0.86  CALCIUM 8.5* 8.5* 9.1 8.4* 9.0   GFR: Estimated Creatinine Clearance: 121 mL/min (by C-G formula based on SCr of 0.86 mg/dL). Liver Function Tests: Recent Labs  Lab 09/23/20 2313  AST 75*  ALT 58*  ALKPHOS 81  BILITOT 2.0*  PROT 7.7  ALBUMIN 4.6   Recent Labs  Lab 09/24/20 0426  LIPASE 33   No results for input(s): AMMONIA in the last 168 hours. Coagulation Profile: No results for input(s): INR, PROTIME in the last 168 hours. Cardiac Enzymes: No results for input(s): CKTOTAL, CKMB, CKMBINDEX, TROPONINI in the last 168 hours. BNP (last 3 results) No results for input(s):  PROBNP in the last 8760 hours. HbA1C: Recent Labs    09/24/20 0137  HGBA1C 8.9*   CBG: Recent Labs  Lab 09/24/20 2117 09/25/20 0732 09/25/20 1127 09/25/20 1245 09/25/20 1635  GLUCAP 259* 228* 321* 335* 331*   Lipid Profile: No results for input(s): CHOL, HDL, LDLCALC, TRIG, CHOLHDL, LDLDIRECT in the last 72 hours. Thyroid Function Tests: No results for input(s): TSH, T4TOTAL, FREET4, T3FREE, THYROIDAB in the last 72 hours. Anemia Panel: No results for input(s): VITAMINB12, FOLATE, FERRITIN, TIBC, IRON, RETICCTPCT in the last 72 hours. Sepsis Labs: No results for input(s): PROCALCITON, LATICACIDVEN in the last 168 hours.  Recent Results (from the past  240 hour(s))  Resp Panel by RT-PCR (Flu A&B, Covid) Nasopharyngeal Swab     Status: None   Collection Time: 09/23/20 11:52 PM   Specimen: Nasopharyngeal Swab; Nasopharyngeal(NP) swabs in vial transport medium  Result Value Ref Range Status   SARS Coronavirus 2 by RT PCR NEGATIVE NEGATIVE Final    Comment: (NOTE) SARS-CoV-2 target nucleic acids are NOT DETECTED.  The SARS-CoV-2 RNA is generally detectable in upper respiratory specimens during the acute phase of infection. The lowest concentration of SARS-CoV-2 viral copies this assay can detect is 138 copies/mL. A negative result does not preclude SARS-Cov-2 infection and should not be used as the sole basis for treatment or other patient management decisions. A negative result may occur with  improper specimen collection/handling, submission of specimen other than nasopharyngeal swab, presence of viral mutation(s) within the areas targeted by this assay, and inadequate number of viral copies(<138 copies/mL). A negative result must be combined with clinical observations, patient history, and epidemiological information. The expected result is Negative.  Fact Sheet for Patients:  BloggerCourse.com  Fact Sheet for Healthcare Providers:  SeriousBroker.it  This test is no t yet approved or cleared by the [place] FDA and  has been authorized for detection and/or diagnosis of SARS-CoV-2 by FDA under an Emergency Use Authorization (EUA). This EUA will remain  in effect (meaning this test can be used) for the duration of the COVID-19 declaration under Section 564(b)(1) of the Act, 21 U.S.C.section 360bbb-3(b)(1), unless the authorization is terminated  or revoked sooner.       Influenza A by PCR NEGATIVE NEGATIVE Final   Influenza B by PCR NEGATIVE NEGATIVE Final    Comment: (NOTE) The Xpert Xpress SARS-CoV-2/FLU/RSV plus assay is intended as an aid in the diagnosis of influenza  from Nasopharyngeal swab specimens and should not be used as a sole basis for treatment. Nasal washings and aspirates are unacceptable for Xpert Xpress SARS-CoV-2/FLU/RSV testing.  Fact Sheet for Patients: BloggerCourse.com  Fact Sheet for Healthcare Providers: SeriousBroker.it  This test is not yet approved or cleared by the [place] FDA and has been authorized for detection and/or diagnosis of SARS-CoV-2 by FDA under an Emergency Use Authorization (EUA). This EUA will remain in effect (meaning this test can be used) for the duration of the COVID-19 declaration under Section 564(b)(1) of the Act, 21 U.S.C. section 360bbb-3(b)(1), unless the authorization is terminated or revoked.  Performed at [facility], [address], [place], [place] [address]          Radiology Studies: No results found.      Scheduled Meds: . amLODipine  10 mg Oral Daily  . aspirin EC  81 mg Oral BID  . atenolol  50 mg Oral Daily  . chlordiazePOXIDE  25 mg Oral TID  . Chlorhexidine Gluconate Cloth  6 each  Topical Daily  . doxepin  25 mg Oral QHS  . enoxaparin (LOVENOX) injection  40 mg Subcutaneous Q24H  . folic acid  1 mg Oral Daily  . insulin aspart  0-15 Units Subcutaneous TID WC  . insulin aspart  0-5 Units Subcutaneous QHS  . insulin glargine  20 Units Subcutaneous QHS  . multivitamin with minerals  1 tablet Oral Daily  . pantoprazole  40 mg Oral Daily  . PARoxetine  20 mg Oral Daily  . simvastatin  20 mg Oral Daily  . thiamine  100 mg Oral Daily  . [START ON 09/28/2020] Vitamin D (Ergocalciferol)  50,000 Units Oral Weekly   Continuous Infusions: . dextrose 5% lactated ringers Stopped (09/24/20 1853)  . insulin Stopped (09/24/20 1853)  . lactated ringers Stopped (09/24/20 1324)    Assessment & Plan:   Principal Problem:   Alcohol abuse Active Problems:   DKA (diabetic ketoacidosis) (HCC)   Alcohol withdrawal  (HCC)   Severe recurrent major depression without psychotic features (HCC)   1.  Diabetic ketoacidosis with uncontrolled type 2 diabetes mellitus.  Abdominal pain improving was placed on insulin drip on DKA protocol per Endo tool Continue IV fluids for hydration Diabetic educator consulted-recommended increasing Lantus to 25 nightly start NovoLog with meals while home oral DM meds on hold, NovoLog 3 units 3 times daily with meals Off insulin gtt now    2.  Alcohol withdrawal. Still going through withdrawal and requiring Valium Psych input was appreciated-recommend continuing Paxil for anxiety and depression at this point will need to be referred to [facility] inpatient program directly if possible    3.  Essential hypertension. -  Stable continue amlodipine and Tenormin    4.  GERD Continue PPI  5.  Dyslipidemia. Continue statin   DVT prophylaxis: Lovenox Code Status: Full Family Communication: None at bedside Disposition Plan:  Status is: Inpatient  Remains inpatient appropriate because:Inpatient level of care appropriate due to severity of illness   Dispo: The patient is from: Home              Anticipated d/c is to: Home              Patient currently is not medically stable to d/c.   Difficult to place patient No            LOS: 1 day   Time spent: 45 min with >50% on coc    Lynn Ito, MD [facility] Pager [phone]  If 7PM-7AM, please contact night-coverage 09/25/2020, 4:45 PM

## 2020-09-26 ENCOUNTER — Encounter: Payer: Self-pay | Admitting: Family Medicine

## 2020-09-26 LAB — GLUCOSE, CAPILLARY
Glucose-Capillary: 290 mg/dL — ABNORMAL HIGH (ref 70–99)
Glucose-Capillary: 301 mg/dL — ABNORMAL HIGH (ref 70–99)
Glucose-Capillary: 245 mg/dL — ABNORMAL HIGH (ref 70–99)
Glucose-Capillary: 272 mg/dL — ABNORMAL HIGH (ref 70–99)
Glucose-Capillary: 291 mg/dL — ABNORMAL HIGH (ref 70–99)

## 2020-09-26 LAB — CBC
HCT: 33.4 % — ABNORMAL LOW (ref 39.0–52.0)
Hemoglobin: 12.2 g/dL — ABNORMAL LOW (ref 13.0–17.0)
MCH: 35.4 pg — ABNORMAL HIGH (ref 26.0–34.0)
MCHC: 36.5 g/dL — ABNORMAL HIGH (ref 30.0–36.0)
MCV: 96.8 fL (ref 80.0–100.0)
Platelets: 93 10*3/uL — ABNORMAL LOW (ref 150–400)
RBC: 3.45 MIL/uL — ABNORMAL LOW (ref 4.22–5.81)
RDW: 13.5 % (ref 11.5–15.5)
WBC: 3.1 10*3/uL — ABNORMAL LOW (ref 4.0–10.5)
nRBC: 0.6 % — ABNORMAL HIGH (ref 0.0–0.2)

## 2020-09-26 MED ORDER — INSULIN ASPART 100 UNIT/ML IJ SOLN
10.0000 [IU] | Freq: Three times a day (TID) | INTRAMUSCULAR | Status: DC
  Administered 2020-09-26 – 2020-09-27 (×2): 10 [IU] via SUBCUTANEOUS
  Filled 2020-09-26 (×2): qty 1

## 2020-09-26 MED ORDER — INSULIN ASPART 100 UNIT/ML IJ SOLN
0.0000 [IU] | INTRAMUSCULAR | Status: DC
  Administered 2020-09-26: 8 [IU] via SUBCUTANEOUS
  Administered 2020-09-27: 5 [IU] via SUBCUTANEOUS
  Administered 2020-09-27 (×2): 8 [IU] via SUBCUTANEOUS
  Filled 2020-09-26 (×5): qty 1

## 2020-09-26 MED ORDER — INSULIN GLARGINE 100 UNIT/ML ~~LOC~~ SOLN
30.0000 [IU] | Freq: Every day | SUBCUTANEOUS | Status: DC
  Administered 2020-09-26: 30 [IU] via SUBCUTANEOUS
  Filled 2020-09-26 (×2): qty 0.3

## 2020-09-26 MED ORDER — INSULIN ASPART 100 UNIT/ML IJ SOLN
6.0000 [IU] | Freq: Three times a day (TID) | INTRAMUSCULAR | Status: DC
  Administered 2020-09-26 (×2): 6 [IU] via SUBCUTANEOUS
  Filled 2020-09-26: qty 1

## 2020-09-26 NOTE — TOC Progression Note (Signed)
Transition of Care [facility]) - Progression Note    Patient Details  Name: Jacob Frederick MRN: [id number] Date of Birth: [birth year]  [facility] CM/SW Contact  Su Hilt, RN Phone Number: 09/26/2020, 2:33 PM  Clinical Narrative:     Met with the patient to discuss DC plan and needs, he does not need DME, He is open to doing [place], I contacted Corene Cornea with [facility] and they accepted the patient   Expected Discharge Plan: [place] Barriers to Discharge: Barriers Resolved  Expected Discharge Plan and Services Expected Discharge Plan: [place]   Discharge Planning Services: CM Consult   Living arrangements for the past 2 months: Single Family Home                 DME Arranged: N/A                     Social Determinants of Health (SDOH) Interventions    Readmission Risk Interventions No flowsheet data found.

## 2020-09-26 NOTE — Progress Notes (Signed)
OT Screen Note  Patient Details Name: Jacob Frederick MRN: [id number] DOB: 1965-10-11   Cancelled Treatment:    Reason Eval/Treat Not Completed: OT screened, no needs identified, will sign off. Pt independent, no skilled OT needs identified. Pt in agreement. Will sign off. Thank you for the consult.   Wynona Canes, MPH, MS, OTR/L ascom [phone] 09/26/20, 2:13 PM

## 2020-09-26 NOTE — Progress Notes (Signed)
Met with the patient to discuss DC plan an needs He does not need DME he stated but is open to [facility] RN, I called Corene Cornea and [facility] accepted the patient for [facility] services,  He is independent at home

## 2020-09-26 NOTE — Progress Notes (Signed)
PROGRESS NOTE    Jacob Frederick  GQQ:[id number] DOB: 20-Jun-1965 DOA: 09/23/2020 PCP: [facility]    Brief Narrative:  Jacob Frederick is a 55 y.o. Caucasian male with medical history significant for type 2 diabetes mellitus, hypertension, Lyme disease and degenerative disc disease, who presented to the ER with acute onset of alcohol withdrawal requesting detox. Upon presentation to the ER, blood pressure was 142/81 with a heart rate of 146 with otherwise normal vital signs.  Labs revealed venous blood gas with a pH 7.36, PCO2 33, PO2 48 and HCO3 18.6 with O2 sat of 81.6.  BMP revealed hyponatremia 129 hypochloremia of 93, hyperglycemia 322 with a calcium of 8.6.  Anion gap was 20.  Beta hydroxybutyrate 3.24.  Influenza antigens and COVID-19 PCR came back negative.    6/8 Per nursing he scored 4 and received this AM.  Last night he also received Valium. 6/9- BG elevated. Still scoring receiving valium this am. Pt reports does not want to go to detox at the [place] and wishes to do it on his own despite me trying to convince him about going to detox.  He reports he has multiple things at home to take care of and cannot go to detox at this time.  But he will try to seek help outside.  Consultants:  Psychiatry  Procedures:   Antimicrobials:      Subjective: Denies nausea, vomiting, chest pain or shortness of breath  Objective: Vitals:   09/25/20 1931 09/26/20 0149 09/26/20 0406 09/26/20 0742  BP: 126/83 119/67 119/83 (!) 134/92  Pulse: 78 87 64 65  Resp: 18  17 16   Temp: 98 F (36.7 C)  98.6 F (37 C) 97.9 F (36.6 C)  TempSrc: Oral  Oral Oral  SpO2: 99%  94% 97%  Weight:      Height:        Intake/Output Summary (Last 24 hours) at 09/26/2020 0807 Last data filed at 09/26/2020 0024 Gross per 24 hour  Intake 960 ml  Output 900 ml  Net 60 ml   Filed Weights   09/23/20 2258  Weight: 99.8 kg    Examination: Calm, NAD CTA no wheeze rales rhonchi's Regular S1-S2 no  gallops Soft benign positive bowel sounds No edema Awake and alert x3.  Fine tremors no asterixis Mood and affect appropriate in current setting    Data Reviewed: I have personally reviewed following labs and imaging studies  CBC: Recent Labs  Lab 09/23/20 2313 09/24/20 0939 09/25/20 0924 09/26/20 0512  WBC 6.3 6.9 3.9* 3.1*  HGB 15.4 12.6* 13.0 12.2*  HCT 42.4 33.7* 35.7* 33.4*  MCV 95.9 93.4 96.7 96.8  PLT 120* 104* 104* 93*   Basic Metabolic Panel: Recent Labs  Lab 09/24/20 0426 09/24/20 0930 09/24/20 1251 09/24/20 1727 09/25/20 0924  NA 131* 132* 135 130* 134*  K 4.0 3.7 3.4* 3.2* 4.2  CL 96* 96* 95* 95* 97*  CO2 18* 23 24 24 25   GLUCOSE 170* 160* 75 213* 242*  BUN 12 12 13 11 10   CREATININE 0.67 0.79 0.97 0.79 0.86  CALCIUM 8.5* 8.5* 9.1 8.4* 9.0   GFR: Estimated Creatinine Clearance: 121 mL/min (by C-G formula based on SCr of 0.86 mg/dL). Liver Function Tests: Recent Labs  Lab 09/23/20 2313  AST 75*  ALT 58*  ALKPHOS 81  BILITOT 2.0*  PROT 7.7  ALBUMIN 4.6   Recent Labs  Lab 09/24/20 0426  LIPASE 33   No results for input(s):  AMMONIA in the last 168 hours. Coagulation Profile: No results for input(s): INR, PROTIME in the last 168 hours. Cardiac Enzymes: No results for input(s): CKTOTAL, CKMB, CKMBINDEX, TROPONINI in the last 168 hours. BNP (last 3 results) No results for input(s): PROBNP in the last 8760 hours. HbA1C: Recent Labs    09/24/20 0137 09/24/20 1727  HGBA1C 8.9* 8.9*   CBG: Recent Labs  Lab 09/25/20 1127 09/25/20 1245 09/25/20 1635 09/25/20 2104 09/26/20 0743  GLUCAP 321* 335* 331* 267* 290*   Lipid Profile: No results for input(s): CHOL, HDL, LDLCALC, TRIG, CHOLHDL, LDLDIRECT in the last 72 hours. Thyroid Function Tests: No results for input(s): TSH, T4TOTAL, FREET4, T3FREE, THYROIDAB in the last 72 hours. Anemia Panel: No results for input(s): VITAMINB12, FOLATE, FERRITIN, TIBC, IRON, RETICCTPCT in the last 72  hours. Sepsis Labs: No results for input(s): PROCALCITON, LATICACIDVEN in the last 168 hours.  Recent Results (from the past 240 hour(s))  Resp Panel by RT-PCR (Flu A&B, Covid) Nasopharyngeal Swab     Status: None   Collection Time: 09/23/20 11:52 PM   Specimen: Nasopharyngeal Swab; Nasopharyngeal(NP) swabs in vial transport medium  Result Value Ref Range Status   SARS Coronavirus 2 by RT PCR NEGATIVE NEGATIVE Final    Comment: (NOTE) SARS-CoV-2 target nucleic acids are NOT DETECTED.  The SARS-CoV-2 RNA is generally detectable in upper respiratory specimens during the acute phase of infection. The lowest concentration of SARS-CoV-2 viral copies this assay can detect is 138 copies/mL. A negative result does not preclude SARS-Cov-2 infection and should not be used as the sole basis for treatment or other patient management decisions. A negative result may occur with  improper specimen collection/handling, submission of specimen other than nasopharyngeal swab, presence of viral mutation(s) within the areas targeted by this assay, and inadequate number of viral copies(<138 copies/mL). A negative result must be combined with clinical observations, patient history, and epidemiological information. The expected result is Negative.  Fact Sheet for Patients:  BloggerCourse.com  Fact Sheet for Healthcare Providers:  SeriousBroker.it  This test is no t yet approved or cleared by the [place] FDA and  has been authorized for detection and/or diagnosis of SARS-CoV-2 by FDA under an Emergency Use Authorization (EUA). This EUA will remain  in effect (meaning this test can be used) for the duration of the COVID-19 declaration under Section 564(b)(1) of the Act, 21 U.S.C.section 360bbb-3(b)(1), unless the authorization is terminated  or revoked sooner.       Influenza A by PCR NEGATIVE NEGATIVE Final   Influenza B by PCR NEGATIVE  NEGATIVE Final    Comment: (NOTE) The Xpert Xpress SARS-CoV-2/FLU/RSV plus assay is intended as an aid in the diagnosis of influenza from Nasopharyngeal swab specimens and should not be used as a sole basis for treatment. Nasal washings and aspirates are unacceptable for Xpert Xpress SARS-CoV-2/FLU/RSV testing.  Fact Sheet for Patients: BloggerCourse.com  Fact Sheet for Healthcare Providers: SeriousBroker.it  This test is not yet approved or cleared by the [place] FDA and has been authorized for detection and/or diagnosis of SARS-CoV-2 by FDA under an Emergency Use Authorization (EUA). This EUA will remain in effect (meaning this test can be used) for the duration of the COVID-19 declaration under Section 564(b)(1) of the Act, 21 U.S.C. section 360bbb-3(b)(1), unless the authorization is terminated or revoked.  Performed at [facility], [address] [place], [place] [address]          Radiology Studies: No results found.  Scheduled Meds:  amLODipine  10 mg Oral Daily   aspirin EC  81 mg Oral BID   atenolol  50 mg Oral Daily   chlordiazePOXIDE  25 mg Oral TID   Chlorhexidine Gluconate Cloth  6 each Topical Daily   doxepin  25 mg Oral QHS   folic acid  1 mg Oral Daily   insulin aspart  0-15 Units Subcutaneous TID WC   insulin aspart  0-5 Units Subcutaneous QHS   insulin aspart  3 Units Subcutaneous TID WC   insulin glargine  30 Units Subcutaneous QHS   liver oil-zinc oxide   Topical BID   multivitamin with minerals  1 tablet Oral Daily   pantoprazole  40 mg Oral Daily   PARoxetine  20 mg Oral Daily   simvastatin  20 mg Oral Daily   thiamine  100 mg Oral Daily   [START ON 09/28/2020] Vitamin D (Ergocalciferol)  50,000 Units Oral Weekly   Continuous Infusions:  dextrose 5% lactated ringers Stopped (09/24/20 1853)   lactated ringers Stopped (09/24/20 7829)    Assessment & Plan:    Principal Problem:   Alcohol abuse Active Problems:   DKA (diabetic ketoacidosis) (HCC)   Alcohol withdrawal (HCC)   Severe recurrent major depression without psychotic features (HCC)   1.  Diabetic ketoacidosis with uncontrolled type 2 diabetes mellitus.  Abdominal pain improving was placed on insulin drip on DKA protocol per Endo tool Of insulin gtt. 6/9-BG still elevated needs better control, will increase Lantus to 30 units nightly, increase NovoLog to 10 3 times daily with meals  Diabetic educator following      2.  Alcohol withdrawal. Still going through withdrawal and requiring Valium Psych input was appreciated-recommend continuing Paxil for anxiety and depression at this point will need to be referred to [facility] inpatient program directly if possible 6/9-still scoring on alcohol protocol withdrawal.  Received Valium On admission alcohol level 243. Continue with protocol [facility] and would like to do it on his own   3.  Essential hypertension. -stable continue amlodipine and Tenormin   4.  Thrombocytopenia-likely from alcohol abuse Hold lovenox for now No sx/s of bleed   4.  GERD Continue PPI   5.  Dyslipidemia. Continue statin   DVT prophylaxis: scd Code Status: Full Family Communication: None at bedside Disposition Plan:  Status is: Inpatient  Remains inpatient appropriate because:Inpatient level of care appropriate due to severity of illness   Dispo: The patient is from: Home              Anticipated d/c is to: Home              Patient currently is not medically stable to d/c.   Difficult to place patient No    Likely dc in am if bg levels stable and less scoring on etoh protocal        LOS: 2 days   Time spent: 35 min with >50% on coc    Lynn Ito, MD [facility] Pager [phone]  If 7PM-7AM, please contact night-coverage 09/26/2020, 8:07 AM

## 2020-09-26 NOTE — Plan of Care (Signed)
?  Problem: Health Behavior/Discharge Planning: ?Goal: Ability to manage health-related needs will improve ?Outcome: Progressing ?  ?Problem: Nutrition: ?Goal: Adequate nutrition will be maintained ?Outcome: Progressing ?  ?Problem: Pain Managment: ?Goal: General experience of comfort will improve ?Outcome: Progressing ?  ?Problem: Safety: ?Goal: Ability to remain free from injury will improve ?Outcome: Progressing ?  ?

## 2020-09-26 NOTE — Evaluation (Signed)
Physical Therapy Evaluation Patient Details Name: Jacob Frederick MRN: [id number] DOB: [birth year] Today's Date: 09/26/2020   History of Present Illness  Patient is a 55 year old male with history significant for type 2 diabetes mellitus, hypertension, Lyme disease and degenerative disc disease, who presented to the ER with acute onset of alcohol withdrawal requesting detox.  Clinical Impression  Patient received in bed, he is agreeable to PT session. States he has been up to  the bathroom a couple of times. Patient is independent with bed mobility, transfers. Slightly unsteady with initial standing and walking, which improved with distance. Ambulated 150 feet with supervision. No lob or difficulty noted. Reports he feels "high" from medicine he received. Patient appears to be close to baseline and does not require skilled PT intervention at this time. He is safe to ambulate with nursing staff.         Follow Up Recommendations No PT follow up    Equipment Recommendations  None recommended by PT    Recommendations for Other Services       Precautions / Restrictions Precautions Precaution Comments: mod fall Restrictions Weight Bearing Restrictions: No      Mobility  Bed Mobility Overal bed mobility: Independent                  Transfers Overall transfer level: Independent                  Ambulation/Gait Ambulation/Gait assistance: Supervision Gait Distance (Feet): 150 Feet Assistive device: None Gait Pattern/deviations: Step-through pattern Gait velocity: normal   General Gait Details: slightly unsteady with initial standing and walking, improved with increased mobility. No lob, no overt difficulties reported or observed.  Stairs            Wheelchair Mobility    Modified Rankin (Stroke Patients Only)       Balance Overall balance assessment: Mild deficits observed, not formally tested                                            Pertinent Vitals/Pain Pain Assessment: 0-10 Pain Score: 3  Pain Location: chronic back pain Pain Descriptors / Indicators: Discomfort Pain Intervention(s): Monitored during session;Repositioned    Home Living Family/patient expects to be discharged to:: Private residence Living Arrangements: Alone             Home Equipment: None      Prior Function Level of Independence: Independent               Hand Dominance        Extremity/Trunk Assessment   Upper Extremity Assessment Upper Extremity Assessment: Overall WFL for tasks assessed    Lower Extremity Assessment Lower Extremity Assessment: Overall WFL for tasks assessed    Cervical / Trunk Assessment Cervical / Trunk Assessment: Normal  Communication   Communication: No difficulties  Cognition Arousal/Alertness: Awake/alert Behavior During Therapy: WFL for tasks assessed/performed Overall Cognitive Status: Within Functional Limits for tasks assessed                                        General Comments      Exercises     Assessment/Plan    PT Assessment Patent does not need any further PT services  PT Problem List  PT Treatment Interventions Gait training    PT Goals (Current goals can be found in the Care Plan section)  Acute Rehab PT Goals Patient Stated Goal: to return home PT Goal Formulation: With patient Time For Goal Achievement: 09/27/20 Potential to Achieve Goals: Good    Frequency     Barriers to discharge        Co-evaluation               AM-PAC PT "6 Clicks" Mobility  Outcome Measure Help needed turning from your back to your side while in a flat bed without using bedrails?: None Help needed moving from lying on your back to sitting on the side of a flat bed without using bedrails?: None Help needed moving to and from a bed to a chair (including a wheelchair)?: None Help needed standing up from a chair using your arms (e.g.,  wheelchair or bedside chair)?: None Help needed to walk in hospital room?: None Help needed climbing 3-5 steps with a railing? : None 6 Click Score: 24    End of Session Equipment Utilized During Treatment: Gait belt Activity Tolerance: Patient tolerated treatment well Patient left: in bed;with call bell/phone within reach;with SCD's reapplied Nurse Communication: Mobility status      Time: 1130-1147 PT Time Calculation (min) (ACUTE ONLY): 17 min   Charges:   PT Evaluation $PT Eval Low Complexity: 1 Low PT Treatments $Gait Training: 8-22 mins        Tait Balistreri, PT, GCS 09/26/20,11:58 AM

## 2020-09-27 LAB — GLUCOSE, CAPILLARY
Glucose-Capillary: 221 mg/dL — ABNORMAL HIGH (ref 70–99)
Glucose-Capillary: 261 mg/dL — ABNORMAL HIGH (ref 70–99)
Glucose-Capillary: 266 mg/dL — ABNORMAL HIGH (ref 70–99)

## 2020-09-27 MED ORDER — INSULIN GLARGINE 100 UNIT/ML SOLOSTAR PEN: 25.0000 [IU] | PEN_INJECTOR | Freq: Every day | SUBCUTANEOUS | 11 refills | Status: DC

## 2020-09-27 MED ORDER — THIAMINE HCL 100 MG PO TABS: 100.0000 mg | ORAL_TABLET | Freq: Every day | ORAL | 0 refills | Status: AC

## 2020-09-27 MED ORDER — FOLIC ACID 1 MG PO TABS: 1.0000 mg | ORAL_TABLET | Freq: Every day | ORAL | 0 refills | Status: AC

## 2020-09-27 NOTE — Progress Notes (Signed)
Blood pressure (!) 143/79, pulse 70, temperature 98.2 F (36.8 C), temperature source Oral, resp. rate 18, height 6\' 2"  (1.88 m), weight 99.8 kg, SpO2 99 %.  Iv catheter removed site was clean, dry and intact. Went over discharge packet with pt, pt verbalized understanding of education and information in the packet. Transported pt via W/C with all belongings to personal car.

## 2020-09-27 NOTE — Care Management Important Message (Signed)
Important Message  Patient Details  Name: Jacob Frederick MRN: [id number] Date of Birth: 1965/09/12   Medicare Important Message Given:  Yes     [name] 09/27/2020, 10:46 AM

## 2020-09-27 NOTE — TOC Transition Note (Signed)
Transition of Care [facility]) - CM/SW Discharge Note   Patient Details  Name: Jacob Frederick MRN: [id number] Date of Birth: 1965-08-10  [facility] CM/SW Contact:  Margarito Liner, LCSW Phone Number: 09/27/2020, 10:25 AM   Clinical Narrative:   Patient has orders to discharge home today. [facility] representative is aware. No further concerns. CSW signing off.  Final next level of care: Home w Home Health Services Barriers to Discharge: Barriers Resolved   Patient Goals and CMS Choice Patient states their goals for this hospitalization and ongoing recovery are:: get home      Discharge Placement                    Patient and family notified of of transfer: 09/27/20  Discharge Plan and Services   Discharge Planning Services: CM Consult            DME Arranged: N/A         HH Arranged: RN HH Agency: [facility] Date HH Agency Contacted: 09/27/20 Time HH Agency Contacted: 1512 Representative spoke with at [facility] Agency: Feliberto Gottron  Social Determinants of Health (SDOH) Interventions     Readmission Risk Interventions No flowsheet data found.

## 2020-09-27 NOTE — Discharge Summary (Addendum)
Jacob Frederick AVW:[id number] DOB: 1965/08/12 DOA: 09/23/2020  PCP: [facility]  Admit date: 09/23/2020 Discharge date: 09/27/2020  Admitted From: home Disposition:  home  Recommendations for Outpatient Follow-up:  Follow up with PCP in 1 week Please obtain BMP/CBC in one week   Home Health:yes    Discharge Condition:Stable CODE STATUS:full  Diet recommendation:  Carb Modified   Brief/Interim Summary: Per HPI: Jacob Frederick is a 55 y.o. Caucasian male with medical history significant for type 2 diabetes mellitus, hypertension, Lyme disease and degenerative disc disease, who presented to the ER with acute onset of alcohol withdrawal requesting detox. Upon presentation to the ER, blood pressure was 142/81 with a heart rate of 146 with otherwise normal vital signs.  Labs revealed venous blood gas with a pH 7.36, PCO2 33, PO2 48 and HCO3 18.6 with O2 sat of 81.6.  BMP revealed hyponatremia 129 hypochloremia of 93, hyperglycemia 322 with a calcium of 8.6.  Anion gap was 20.  Beta hydroxybutyrate 3.24.  Influenza antigens and COVID-19 PCR came back negative. He was admitted for DKA and was started on insulin protocal. Also placed on ETOH withdrawal protocal.  1.  Diabetic ketoacidosis with uncontrolled type 2 diabetes mellitus.  was placed on insulin drip on DKA protocol per Endo tool Of insulin gtt. BG more stable. Received diabetic education  Will need to f/u with pcp /endocrinologist for outpt management       2.  Alcohol withdrawal. Was scoring while in the hospital.  This a.m. he did not requiring any Valium. Patient declined [facility] to detox and would like to do it on his own. Did counsel him on alcohol cessation and really encouraged him to follow-up with a detox program or AA meetings     3.  Essential hypertension. continue amlodipine and Tenormin     4.  Thrombocytopenia-likely from alcohol abuse No sx/s of bleed On aspirin 81 mg twice daily chronically  since he takes testosterone and was told to take this.   4.  GERD Continue PPI   5.  Dyslipidemia. Continue statin    Discharge Diagnoses:  Principal Problem:   Alcohol abuse Active Problems:   DKA (diabetic ketoacidosis) (HCC)   Alcohol withdrawal (HCC)   Severe recurrent major depression without psychotic features [facility])    Discharge Instructions  Discharge Instructions     Call MD for:  temperature >100.4   Complete by: As directed    Diet - low sodium heart healthy   Complete by: As directed    Diet Carb Modified   Complete by: As directed    Discharge instructions   Complete by: As directed    [facility] within one week Follow low carb diet Follow up with detox or AA meetings   Increase activity slowly   Complete by: As directed       Allergies as of 09/27/2020       Reactions   Contrast Media [iodinated Diagnostic Agents] Swelling   Ativan [lorazepam] Other (See Comments)   Hallucinations   Iohexol Swelling   Facial swelling, arm and leg swelling after CT scan with contrast   Lisinopril Swelling   Angioedema        Medication List     STOP taking these medications    acetaminophen 325 MG tablet Commonly known as: TYLENOL   albuterol 108 (90 Base) MCG/ACT inhaler Commonly known as: VENTOLIN HFA   azithromycin 500 MG tablet Commonly known  as: Zithromax   chlorpheniramine-HYDROcodone 10-8 MG/5ML Suer Commonly known as: Tussionex Pennkinetic ER   doxycycline 100 MG tablet Commonly known as: VIBRA-TABS   ibuprofen 800 MG tablet Commonly known as: ADVIL   ketorolac 10 MG tablet Commonly known as: TORADOL   oxyCODONE-acetaminophen 5-325 MG tablet Commonly known as: PERCOCET/ROXICET   predniSONE 10 MG (21) Tbpk tablet Commonly known as: STERAPRED UNI-PAK 21 TAB   sildenafil 100 MG tablet Commonly known as: VIAGRA   traMADol 50 MG tablet Commonly known as: Ultram       TAKE these medications     amLODipine 10 MG tablet Commonly known as: NORVASC Take 10 mg by mouth daily.   aspirin 81 MG EC tablet Take 81 mg by mouth 2 (two) times daily.   atenolol 50 MG tablet Commonly known as: TENORMIN Take 1 tablet by mouth daily.   buPROPion 300 MG 24 hr tablet Commonly known as: WELLBUTRIN XL Take 300 mg by mouth daily.   doxepin 25 MG capsule Commonly known as: SINEQUAN Take 25 mg by mouth daily.   ergocalciferol 1.25 MG (50000 UT) capsule Commonly known as: VITAMIN D2 Take 1 capsule by mouth daily.   folic acid 1 MG tablet Commonly known as: FOLVITE Take 1 tablet (1 mg total) by mouth daily.   glipiZIDE 10 MG tablet Commonly known as: GLUCOTROL Take 10 mg by mouth daily.   insulin glargine 100 UNIT/ML Solostar Pen Commonly known as: LANTUS Inject 25 Units into the skin daily. What changed: how much to take   metFORMIN 500 MG tablet Commonly known as: GLUCOPHAGE Take 500 mg by mouth 2 (two) times daily.   Multi-Vitamin tablet Take 1 tablet by mouth daily.   omeprazole 40 MG capsule Commonly known as: PRILOSEC Take 40 mg by mouth daily.   PARoxetine 40 MG tablet Commonly known as: PAXIL Take 40 mg by mouth daily.   simvastatin 20 MG tablet Commonly known as: ZOCOR Take 20 mg by mouth daily.   testosterone cypionate 200 MG/ML injection Commonly known as: DEPOTESTOSTERONE CYPIONATE 0.58mL weekly IM   thiamine 100 MG tablet Take 1 tablet (100 mg total) by mouth daily.        Follow-up Information     [facility] Follow up in 1 week(s).   Specialty: General Practice Contact information: [address] [place] [address] [phone]                Allergies  Allergen Reactions   Contrast Media [Iodinated Diagnostic Agents] Swelling   Ativan [Lorazepam] Other (See Comments)    Hallucinations   Iohexol Swelling    Facial swelling, arm and leg swelling after CT scan with contrast   Lisinopril Swelling    Angioedema     Consultations:    Procedures/Studies: No results found.    Subjective: Feels better.  No chest pain, abdominal pain, nausea vomiting.   Discharge Exam: Vitals:   09/27/20 0444 09/27/20 0700  BP: 101/75 (!) 143/79  Pulse: (!) 59 70  Resp: 20 18  Temp: 98.7 F (37.1 C) 98.2 F (36.8 C)  SpO2: 96% 99%   Vitals:   09/26/20 1516 09/26/20 2007 09/27/20 0444 09/27/20 0700  BP: 109/75 111/75 101/75 (!) 143/79  Pulse: 67 64 (!) 59 70  Resp: (!) 22 20 20 18   Temp: 98.1 F (36.7 C)  98.7 F (37.1 C) 98.2 F (36.8 C)  TempSrc: Oral  Oral Oral  SpO2: 96% 96% 96% 99%  Weight:  Height:        General: Pt is alert, awake, not in acute distress Cardiovascular: RRR, S1/S2 +, no rubs, no gallops Respiratory: CTA bilaterally, no wheezing, no rhonchi Abdominal: Soft, NT, ND, bowel sounds + Extremities: no edema    The results of significant diagnostics from this hospitalization (including imaging, microbiology, ancillary and laboratory) are listed below for reference.     Microbiology: Recent Results (from the past 240 hour(s))  Resp Panel by RT-PCR (Flu A&B, Covid) Nasopharyngeal Swab     Status: None   Collection Time: 09/23/20 11:52 PM   Specimen: Nasopharyngeal Swab; Nasopharyngeal(NP) swabs in vial transport medium  Result Value Ref Range Status   SARS Coronavirus 2 by RT PCR NEGATIVE NEGATIVE Final    Comment: (NOTE) SARS-CoV-2 target nucleic acids are NOT DETECTED.  The SARS-CoV-2 RNA is generally detectable in upper respiratory specimens during the acute phase of infection. The lowest concentration of SARS-CoV-2 viral copies this assay can detect is 138 copies/mL. A negative result does not preclude SARS-Cov-2 infection and should not be used as the sole basis for treatment or other patient management decisions. A negative result may occur with  improper specimen collection/handling, submission of specimen other than nasopharyngeal swab, presence of viral  mutation(s) within the areas targeted by this assay, and inadequate number of viral copies(<138 copies/mL). A negative result must be combined with clinical observations, patient history, and epidemiological information. The expected result is Negative.  Fact Sheet for Patients:  BloggerCourse.comhttps://www.fda.gov/media/152166/download  Fact Sheet for Healthcare Providers:  SeriousBroker.ithttps://www.fda.gov/media/152162/download  This test is no t yet approved or cleared by the [place] FDA and  has been authorized for detection and/or diagnosis of SARS-CoV-2 by FDA under an Emergency Use Authorization (EUA). This EUA will remain  in effect (meaning this test can be used) for the duration of the COVID-19 declaration under Section 564(b)(1) of the Act, 21 U.S.C.section 360bbb-3(b)(1), unless the authorization is terminated  or revoked sooner.       Influenza A by PCR NEGATIVE NEGATIVE Final   Influenza B by PCR NEGATIVE NEGATIVE Final    Comment: (NOTE) The Xpert Xpress SARS-CoV-2/FLU/RSV plus assay is intended as an aid in the diagnosis of influenza from Nasopharyngeal swab specimens and should not be used as a sole basis for treatment. Nasal washings and aspirates are unacceptable for Xpert Xpress SARS-CoV-2/FLU/RSV testing.  Fact Sheet for Patients: BloggerCourse.comhttps://www.fda.gov/media/152166/download  Fact Sheet for Healthcare Providers: SeriousBroker.ithttps://www.fda.gov/media/152162/download  This test is not yet approved or cleared by the [place] FDA and has been authorized for detection and/or diagnosis of SARS-CoV-2 by FDA under an Emergency Use Authorization (EUA). This EUA will remain in effect (meaning this test can be used) for the duration of the COVID-19 declaration under Section 564(b)(1) of the Act, 21 U.S.C. section 360bbb-3(b)(1), unless the authorization is terminated or revoked.  Performed at [facility], [address] [place], [place] [id number]      Labs: BNP (last 3  results) No results for input(s): BNP in the last 8760 hours. Basic Metabolic Panel: Recent Labs  Lab 09/24/20 0426 09/24/20 0930 09/24/20 1251 09/24/20 1727 09/25/20 0924  NA 131* 132* 135 130* 134*  K 4.0 3.7 3.4* 3.2* 4.2  CL 96* 96* 95* 95* 97*  CO2 18* 23 24 24 25   GLUCOSE 170* 160* 75 213* 242*  BUN 12 12 13 11 10   CREATININE 0.67 0.79 0.97 0.79 0.86  CALCIUM 8.5* 8.5* 9.1 8.4* 9.0   Liver Function Tests: Recent Labs  Lab 09/23/20 2313  AST 75*  ALT 58*  ALKPHOS 81  BILITOT 2.0*  PROT 7.7  ALBUMIN 4.6   Recent Labs  Lab 09/24/20 0426  LIPASE 33   No results for input(s): AMMONIA in the last 168 hours. CBC: Recent Labs  Lab 09/23/20 2313 09/24/20 0939 09/25/20 0924 09/26/20 0512  WBC 6.3 6.9 3.9* 3.1*  HGB 15.4 12.6* 13.0 12.2*  HCT 42.4 33.7* 35.7* 33.4*  MCV 95.9 93.4 96.7 96.8  PLT 120* 104* 104* 93*   Cardiac Enzymes: No results for input(s): CKTOTAL, CKMB, CKMBINDEX, TROPONINI in the last 168 hours. BNP: Invalid input(s): POCBNP CBG: Recent Labs  Lab 09/26/20 1708 09/26/20 2008 09/27/20 0004 09/27/20 0436 09/27/20 0758  GLUCAP 272* 291* 266* 261* 221*   D-Dimer No results for input(s): DDIMER in the last 72 hours. Hgb A1c Recent Labs    09/24/20 1727  HGBA1C 8.9*   Lipid Profile No results for input(s): CHOL, HDL, LDLCALC, TRIG, CHOLHDL, LDLDIRECT in the last 72 hours. Thyroid function studies No results for input(s): TSH, T4TOTAL, T3FREE, THYROIDAB in the last 72 hours.  Invalid input(s): FREET3 Anemia work up No results for input(s): VITAMINB12, FOLATE, FERRITIN, TIBC, IRON, RETICCTPCT in the last 72 hours. Urinalysis    Component Value Date/Time   COLORURINE YELLOW (A) 09/24/2020 0426   APPEARANCEUR CLEAR (A) 09/24/2020 0426   LABSPEC 1.028 09/24/2020 0426   PHURINE 5.0 09/24/2020 0426   GLUCOSEU >=500 (A) 09/24/2020 0426   HGBUR MODERATE (A) 09/24/2020 0426   BILIRUBINUR NEGATIVE 09/24/2020 0426   KETONESUR 80 (A)  09/24/2020 0426   PROTEINUR 100 (A) 09/24/2020 0426   NITRITE NEGATIVE 09/24/2020 0426   LEUKOCYTESUR NEGATIVE 09/24/2020 0426   Sepsis Labs Invalid input(s): PROCALCITONIN,  WBC,  LACTICIDVEN Microbiology Recent Results (from the past 240 hour(s))  Resp Panel by RT-PCR (Flu A&B, Covid) Nasopharyngeal Swab     Status: None   Collection Time: 09/23/20 11:52 PM   Specimen: Nasopharyngeal Swab; Nasopharyngeal(NP) swabs in vial transport medium  Result Value Ref Range Status   SARS Coronavirus 2 by RT PCR NEGATIVE NEGATIVE Final    Comment: (NOTE) SARS-CoV-2 target nucleic acids are NOT DETECTED.  The SARS-CoV-2 RNA is generally detectable in upper respiratory specimens during the acute phase of infection. The lowest concentration of SARS-CoV-2 viral copies this assay can detect is 138 copies/mL. A negative result does not preclude SARS-Cov-2 infection and should not be used as the sole basis for treatment or other patient management decisions. A negative result may occur with  improper specimen collection/handling, submission of specimen other than nasopharyngeal swab, presence of viral mutation(s) within the areas targeted by this assay, and inadequate number of viral copies(<138 copies/mL). A negative result must be combined with clinical observations, patient history, and epidemiological information. The expected result is Negative.  Fact Sheet for Patients:  BloggerCourse.com  Fact Sheet for Healthcare Providers:  SeriousBroker.it  This test is no t yet approved or cleared by the [place] FDA and  has been authorized for detection and/or diagnosis of SARS-CoV-2 by FDA under an Emergency Use Authorization (EUA). This EUA will remain  in effect (meaning this test can be used) for the duration of the COVID-19 declaration under Section 564(b)(1) of the Act, 21 U.S.C.section 360bbb-3(b)(1), unless the authorization is  terminated  or revoked sooner.       Influenza A by PCR NEGATIVE NEGATIVE Final   Influenza B by PCR NEGATIVE NEGATIVE Final    Comment: (NOTE) The Xpert  Xpress SARS-CoV-2/FLU/RSV plus assay is intended as an aid in the diagnosis of influenza from Nasopharyngeal swab specimens and should not be used as a sole basis for treatment. Nasal washings and aspirates are unacceptable for Xpert Xpress SARS-CoV-2/FLU/RSV testing.  Fact Sheet for Patients: BloggerCourse.com  Fact Sheet for Healthcare Providers: SeriousBroker.it  This test is not yet approved or cleared by the [place] FDA and has been authorized for detection and/or diagnosis of SARS-CoV-2 by FDA under an Emergency Use Authorization (EUA). This EUA will remain in effect (meaning this test can be used) for the duration of the COVID-19 declaration under Section 564(b)(1) of the Act, 21 U.S.C. section 360bbb-3(b)(1), unless the authorization is terminated or revoked.  Performed at [facility], [address] [place], [place] [address]      Time coordinating discharge: Over 30 minutes  SIGNED:   Lynn Ito, MD  [facility] 09/27/2020, 9:10 AM Pager   If 7PM-7AM, please contact night-coverage [email or web address] Password [email or web address]

## 2020-10-01 LAB — BLOOD GAS, VENOUS
Acid-base deficit: 5.8 mmol/L — ABNORMAL HIGH (ref 0.0–2.0)
Bicarbonate: 18.6 mmol/L — ABNORMAL LOW (ref 20.0–28.0)
O2 Saturation: 81.6 %
Patient temperature: 37
pCO2, Ven: 33 mmHg — ABNORMAL LOW (ref 44.0–60.0)
pH, Ven: 7.36 (ref 7.250–7.430)
pO2, Ven: 48 mmHg — ABNORMAL HIGH (ref 32.0–45.0)

## 2021-03-11 ENCOUNTER — Emergency Department: Payer: Medicare Other

## 2021-03-11 ENCOUNTER — Other Ambulatory Visit: Payer: Self-pay

## 2021-03-11 ENCOUNTER — Inpatient Hospital Stay
Admission: EM | Admit: 2021-03-11 | Discharge: 2021-03-16 | DRG: 368 | Disposition: A | Discharge status: Home / Self Care | Payer: Medicare Other | Attending: Student in an Organized Health Care Education/Training Program | Admitting: Student in an Organized Health Care Education/Training Program

## 2021-03-11 ENCOUNTER — Encounter: Payer: Self-pay | Admitting: Emergency Medicine

## 2021-03-11 DIAGNOSIS — F1010 Alcohol abuse, uncomplicated: Secondary | ICD-10-CM | POA: Diagnosis not present

## 2021-03-11 DIAGNOSIS — G8929 Other chronic pain: Secondary | ICD-10-CM | POA: Diagnosis present

## 2021-03-11 DIAGNOSIS — Z20822 Contact with and (suspected) exposure to covid-19: Secondary | ICD-10-CM | POA: Diagnosis present

## 2021-03-11 DIAGNOSIS — K3189 Other diseases of stomach and duodenum: Secondary | ICD-10-CM | POA: Diagnosis present

## 2021-03-11 DIAGNOSIS — Z7982 Long term (current) use of aspirin: Secondary | ICD-10-CM | POA: Diagnosis not present

## 2021-03-11 DIAGNOSIS — K2101 Gastro-esophageal reflux disease with esophagitis, with bleeding: Secondary | ICD-10-CM | POA: Diagnosis present

## 2021-03-11 DIAGNOSIS — K226 Gastro-esophageal laceration-hemorrhage syndrome: Principal | ICD-10-CM

## 2021-03-11 DIAGNOSIS — Z91128 Patient's intentional underdosing of medication regimen for other reason: Secondary | ICD-10-CM | POA: Diagnosis not present

## 2021-03-11 DIAGNOSIS — E1110 Type 2 diabetes mellitus with ketoacidosis without coma: Secondary | ICD-10-CM | POA: Diagnosis present

## 2021-03-11 DIAGNOSIS — D6959 Other secondary thrombocytopenia: Secondary | ICD-10-CM | POA: Diagnosis present

## 2021-03-11 DIAGNOSIS — E785 Hyperlipidemia, unspecified: Secondary | ICD-10-CM | POA: Diagnosis present

## 2021-03-11 DIAGNOSIS — F10239 Alcohol dependence with withdrawal, unspecified: Secondary | ICD-10-CM | POA: Diagnosis present

## 2021-03-11 DIAGNOSIS — E8729 Other acidosis: Secondary | ICD-10-CM | POA: Diagnosis present

## 2021-03-11 DIAGNOSIS — E119 Type 2 diabetes mellitus without complications: Secondary | ICD-10-CM

## 2021-03-11 DIAGNOSIS — I10 Essential (primary) hypertension: Secondary | ICD-10-CM | POA: Diagnosis present

## 2021-03-11 DIAGNOSIS — Z79899 Other long term (current) drug therapy: Secondary | ICD-10-CM | POA: Diagnosis not present

## 2021-03-11 DIAGNOSIS — K2920 Alcoholic gastritis without bleeding: Secondary | ICD-10-CM | POA: Diagnosis present

## 2021-03-11 DIAGNOSIS — Z9114 Patient's other noncompliance with medication regimen: Secondary | ICD-10-CM | POA: Diagnosis not present

## 2021-03-11 DIAGNOSIS — Z87891 Personal history of nicotine dependence: Secondary | ICD-10-CM

## 2021-03-11 DIAGNOSIS — R630 Anorexia: Secondary | ICD-10-CM | POA: Diagnosis present

## 2021-03-11 DIAGNOSIS — E872 Acidosis, unspecified: Secondary | ICD-10-CM | POA: Diagnosis present

## 2021-03-11 DIAGNOSIS — Z7984 Long term (current) use of oral hypoglycemic drugs: Secondary | ICD-10-CM

## 2021-03-11 DIAGNOSIS — F32A Depression, unspecified: Secondary | ICD-10-CM | POA: Diagnosis present

## 2021-03-11 DIAGNOSIS — T383X6A Underdosing of insulin and oral hypoglycemic [antidiabetic] drugs, initial encounter: Secondary | ICD-10-CM | POA: Diagnosis present

## 2021-03-11 DIAGNOSIS — M549 Dorsalgia, unspecified: Secondary | ICD-10-CM | POA: Diagnosis present

## 2021-03-11 DIAGNOSIS — K219 Gastro-esophageal reflux disease without esophagitis: Secondary | ICD-10-CM | POA: Diagnosis not present

## 2021-03-11 DIAGNOSIS — F4310 Post-traumatic stress disorder, unspecified: Secondary | ICD-10-CM | POA: Diagnosis present

## 2021-03-11 DIAGNOSIS — Z794 Long term (current) use of insulin: Secondary | ICD-10-CM

## 2021-03-11 DIAGNOSIS — Z91041 Radiographic dye allergy status: Secondary | ICD-10-CM

## 2021-03-11 DIAGNOSIS — K920 Hematemesis: Secondary | ICD-10-CM | POA: Diagnosis not present

## 2021-03-11 DIAGNOSIS — R7989 Other specified abnormal findings of blood chemistry: Secondary | ICD-10-CM | POA: Diagnosis not present

## 2021-03-11 DIAGNOSIS — M5030 Other cervical disc degeneration, unspecified cervical region: Secondary | ICD-10-CM | POA: Diagnosis present

## 2021-03-11 DIAGNOSIS — F10939 Alcohol use, unspecified with withdrawal, unspecified: Secondary | ICD-10-CM | POA: Diagnosis not present

## 2021-03-11 DIAGNOSIS — K7030 Alcoholic cirrhosis of liver without ascites: Secondary | ICD-10-CM | POA: Diagnosis present

## 2021-03-11 DIAGNOSIS — Z888 Allergy status to other drugs, medicaments and biological substances status: Secondary | ICD-10-CM

## 2021-03-11 DIAGNOSIS — F320 Major depressive disorder, single episode, mild: Secondary | ICD-10-CM | POA: Diagnosis not present

## 2021-03-11 DIAGNOSIS — E1010 Type 1 diabetes mellitus with ketoacidosis without coma: Secondary | ICD-10-CM

## 2021-03-11 DIAGNOSIS — F10930 Alcohol use, unspecified with withdrawal, uncomplicated: Secondary | ICD-10-CM | POA: Diagnosis not present

## 2021-03-11 LAB — PROCALCITONIN: Procalcitonin: <0.1 ng/mL

## 2021-03-11 LAB — BASIC METABOLIC PANEL
Anion gap: 20 — ABNORMAL HIGH (ref 5–15)
Anion gap: 10 (ref 5–15)
Anion gap: 7 (ref 5–15)
BUN: 17 mg/dL (ref 6–20)
BUN: 15 mg/dL (ref 6–20)
BUN: 15 mg/dL (ref 6–20)
CO2: 18 mmol/L — ABNORMAL LOW (ref 22–32)
CO2: 24 mmol/L (ref 22–32)
CO2: 29 mmol/L (ref 22–32)
Calcium: 8 mg/dL — ABNORMAL LOW (ref 8.9–10.3)
Calcium: 7.6 mg/dL — ABNORMAL LOW (ref 8.9–10.3)
Calcium: 7.6 mg/dL — ABNORMAL LOW (ref 8.9–10.3)
Chloride: 95 mmol/L — ABNORMAL LOW (ref 98–111)
Chloride: 98 mmol/L (ref 98–111)
Chloride: 97 mmol/L — ABNORMAL LOW (ref 98–111)
Creatinine, Ser: 0.88 mg/dL (ref 0.61–1.24)
Creatinine, Ser: 0.83 mg/dL (ref 0.61–1.24)
Creatinine, Ser: 0.57 mg/dL — ABNORMAL LOW (ref 0.61–1.24)
GFR, Estimated: >60 mL/min (ref >60)
GFR, Estimated: >60 mL/min (ref >60)
GFR, Estimated: >60 mL/min (ref >60)
Glucose, Bld: 267 mg/dL — ABNORMAL HIGH (ref 70–99)
Glucose, Bld: 279 mg/dL — ABNORMAL HIGH (ref 70–99)
Glucose, Bld: 127 mg/dL — ABNORMAL HIGH (ref 70–99)
Potassium: 4.6 mmol/L (ref 3.5–5.1)
Potassium: 4.3 mmol/L (ref 3.5–5.1)
Potassium: 3.7 mmol/L (ref 3.5–5.1)
Sodium: 133 mmol/L — ABNORMAL LOW (ref 135–145)
Sodium: 132 mmol/L — ABNORMAL LOW (ref 135–145)
Sodium: 133 mmol/L — ABNORMAL LOW (ref 135–145)

## 2021-03-11 LAB — CBC WITH DIFFERENTIAL/PLATELET
Abs Immature Granulocytes: 0.14 10*3/uL — ABNORMAL HIGH (ref 0.00–0.07)
Basophils Absolute: 0 10*3/uL (ref 0.0–0.1)
Basophils Relative: 0 %
Eosinophils Absolute: 0 10*3/uL (ref 0.0–0.5)
Eosinophils Relative: 0 %
HCT: 42.2 % (ref 39.0–52.0)
Hemoglobin: 14.7 g/dL (ref 13.0–17.0)
Immature Granulocytes: 1 %
Lymphocytes Relative: 8 %
Lymphs Abs: 0.8 10*3/uL (ref 0.7–4.0)
MCH: 34.7 pg — ABNORMAL HIGH (ref 26.0–34.0)
MCHC: 34.8 g/dL (ref 30.0–36.0)
MCV: 99.5 fL (ref 80.0–100.0)
Monocytes Absolute: 0.8 10*3/uL (ref 0.1–1.0)
Monocytes Relative: 7 %
Neutro Abs: 9 10*3/uL — ABNORMAL HIGH (ref 1.7–7.7)
Neutrophils Relative %: 84 %
Platelets: 212 10*3/uL (ref 150–400)
RBC: 4.24 MIL/uL (ref 4.22–5.81)
RDW: 13.1 % (ref 11.5–15.5)
WBC: 10.8 10*3/uL — ABNORMAL HIGH (ref 4.0–10.5)
nRBC: 0 % (ref 0.0–0.2)

## 2021-03-11 LAB — COMPREHENSIVE METABOLIC PANEL
ALT: 39 U/L (ref 0–44)
AST: 80 U/L — ABNORMAL HIGH (ref 15–41)
Albumin: 4.2 g/dL (ref 3.5–5.0)
Alkaline Phosphatase: 74 U/L (ref 38–126)
Anion gap: 28 — ABNORMAL HIGH (ref 5–15)
BUN: 17 mg/dL (ref 6–20)
CO2: 14 mmol/L — ABNORMAL LOW (ref 22–32)
Calcium: 8.4 mg/dL — ABNORMAL LOW (ref 8.9–10.3)
Chloride: 91 mmol/L — ABNORMAL LOW (ref 98–111)
Creatinine, Ser: 0.97 mg/dL (ref 0.61–1.24)
GFR, Estimated: >60 mL/min (ref >60)
Glucose, Bld: 438 mg/dL — ABNORMAL HIGH (ref 70–99)
Potassium: 4.5 mmol/L (ref 3.5–5.1)
Sodium: 133 mmol/L — ABNORMAL LOW (ref 135–145)
Total Bilirubin: 1.9 mg/dL — ABNORMAL HIGH (ref 0.3–1.2)
Total Protein: 7.6 g/dL (ref 6.5–8.1)

## 2021-03-11 LAB — URINALYSIS, COMPLETE (UACMP) WITH MICROSCOPIC
Bilirubin Urine: NEGATIVE
Glucose, UA: >=500 mg/dL — AB
Hgb urine dipstick: NEGATIVE
Ketones, ur: 20 mg/dL — AB
Leukocytes,Ua: NEGATIVE
Nitrite: NEGATIVE
Protein, ur: 30 mg/dL — AB
Specific Gravity, Urine: 1.018 (ref 1.005–1.030)
pH: 5 (ref 5.0–8.0)

## 2021-03-11 LAB — CBC
HCT: 37 % — ABNORMAL LOW (ref 39.0–52.0)
HCT: 32.1 % — ABNORMAL LOW (ref 39.0–52.0)
Hemoglobin: 13.2 g/dL (ref 13.0–17.0)
Hemoglobin: 11.3 g/dL — ABNORMAL LOW (ref 13.0–17.0)
MCH: 35.1 pg — ABNORMAL HIGH (ref 26.0–34.0)
MCH: 34.1 pg — ABNORMAL HIGH (ref 26.0–34.0)
MCHC: 35.7 g/dL (ref 30.0–36.0)
MCHC: 35.2 g/dL (ref 30.0–36.0)
MCV: 98.4 fL (ref 80.0–100.0)
MCV: 97 fL (ref 80.0–100.0)
Platelets: 156 10*3/uL (ref 150–400)
Platelets: 127 10*3/uL — ABNORMAL LOW (ref 150–400)
RBC: 3.76 MIL/uL — ABNORMAL LOW (ref 4.22–5.81)
RBC: 3.31 MIL/uL — ABNORMAL LOW (ref 4.22–5.81)
RDW: 12.9 % (ref 11.5–15.5)
RDW: 12.9 % (ref 11.5–15.5)
WBC: 8.2 10*3/uL (ref 4.0–10.5)
WBC: 6.1 10*3/uL (ref 4.0–10.5)
nRBC: 0 % (ref 0.0–0.2)
nRBC: 0 % (ref 0.0–0.2)

## 2021-03-11 LAB — GLUCOSE, CAPILLARY
Glucose-Capillary: 311 mg/dL — ABNORMAL HIGH (ref 70–99)
Glucose-Capillary: 175 mg/dL — ABNORMAL HIGH (ref 70–99)
Glucose-Capillary: 171 mg/dL — ABNORMAL HIGH (ref 70–99)
Glucose-Capillary: 296 mg/dL — ABNORMAL HIGH (ref 70–99)
Glucose-Capillary: 203 mg/dL — ABNORMAL HIGH (ref 70–99)
Glucose-Capillary: 139 mg/dL — ABNORMAL HIGH (ref 70–99)
Glucose-Capillary: 116 mg/dL — ABNORMAL HIGH (ref 70–99)
Glucose-Capillary: 124 mg/dL — ABNORMAL HIGH (ref 70–99)

## 2021-03-11 LAB — BLOOD GAS, VENOUS
Acid-base deficit: 6.4 mmol/L — ABNORMAL HIGH (ref 0.0–2.0)
Bicarbonate: 16.5 mmol/L — ABNORMAL LOW (ref 20.0–28.0)
O2 Saturation: 79.1 %
Patient temperature: 37
pCO2, Ven: 26 mmHg — ABNORMAL LOW (ref 44.0–60.0)
pH, Ven: 7.41 (ref 7.250–7.430)
pO2, Ven: 43 mmHg (ref 32.0–45.0)

## 2021-03-11 LAB — LIPASE, BLOOD: Lipase: 25 U/L (ref 11–51)

## 2021-03-11 LAB — CBG MONITORING, ED: Glucose-Capillary: 406 mg/dL — ABNORMAL HIGH (ref 70–99)

## 2021-03-11 LAB — LACTIC ACID, PLASMA
Lactic Acid, Venous: >9 mmol/L (ref 0.5–1.9)
Lactic Acid, Venous: 6.1 mmol/L (ref 0.5–1.9)
Lactic Acid, Venous: 2.2 mmol/L (ref 0.5–1.9)

## 2021-03-11 LAB — TYPE AND SCREEN
ABO/RH(D): O NEG
Antibody Screen: NEGATIVE

## 2021-03-11 LAB — BETA-HYDROXYBUTYRIC ACID: Beta-Hydroxybutyric Acid: 2.64 mmol/L — ABNORMAL HIGH (ref 0.05–0.27)

## 2021-03-11 LAB — RESP PANEL BY RT-PCR (FLU A&B, COVID) ARPGX2
Influenza A by PCR: NEGATIVE
Influenza B by PCR: NEGATIVE
SARS Coronavirus 2 by RT PCR: NEGATIVE

## 2021-03-11 LAB — MRSA NEXT GEN BY PCR, NASAL: MRSA by PCR Next Gen: NOT DETECTED

## 2021-03-11 LAB — PROTIME-INR
INR: 1.2 (ref 0.8–1.2)
Prothrombin Time: 15.3 seconds — ABNORMAL HIGH (ref 11.4–15.2)

## 2021-03-11 LAB — MAGNESIUM: Magnesium: 1.7 mg/dL (ref 1.7–2.4)

## 2021-03-11 LAB — HEMOGLOBIN A1C
Hgb A1c MFr Bld: 8.8 % — ABNORMAL HIGH (ref 4.8–5.6)
Mean Plasma Glucose: 205.86 mg/dL

## 2021-03-11 LAB — ALBUMIN: Albumin: 3.1 g/dL — ABNORMAL LOW (ref 3.5–5.0)

## 2021-03-11 MED ORDER — PANTOPRAZOLE INFUSION (NEW) - SIMPLE MED
8.0000 mg/h | INTRAVENOUS | Status: DC
  Administered 2021-03-12: 8 mg/h via INTRAVENOUS
  Filled 2021-03-11 (×3): qty 100

## 2021-03-11 MED ORDER — ONDANSETRON HCL 4 MG/2ML IJ SOLN
4.0000 mg | Freq: Once | INTRAMUSCULAR | Status: AC
  Administered 2021-03-11: 4 mg via INTRAVENOUS
  Filled 2021-03-11: qty 2

## 2021-03-11 MED ORDER — GABAPENTIN 300 MG PO CAPS
300.0000 mg | ORAL_CAPSULE | Freq: Three times a day (TID) | ORAL | Status: DC | PRN
  Administered 2021-03-12 – 2021-03-16 (×5): 300 mg via ORAL
  Filled 2021-03-11 (×5): qty 1

## 2021-03-11 MED ORDER — DEXTROSE 50 % IV SOLN: 0.0000 mL | INTRAVENOUS | Status: DC | PRN

## 2021-03-11 MED ORDER — ONDANSETRON HCL 4 MG/2ML IJ SOLN: 4.0000 mg | Freq: Three times a day (TID) | INTRAMUSCULAR | Status: DC | PRN

## 2021-03-11 MED ORDER — FOLIC ACID 1 MG PO TABS
1.0000 mg | ORAL_TABLET | Freq: Every day | ORAL | Status: DC
  Administered 2021-03-11 – 2021-03-16 (×6): 1 mg via ORAL
  Filled 2021-03-11 (×6): qty 1

## 2021-03-11 MED ORDER — DEXTROSE IN LACTATED RINGERS 5 % IV SOLN: INTRAVENOUS | Status: DC

## 2021-03-11 MED ORDER — LACTATED RINGERS IV BOLUS
2000.0000 mL | Freq: Once | INTRAVENOUS | Status: AC
  Administered 2021-03-11: 2000 mL via INTRAVENOUS

## 2021-03-11 MED ORDER — LORAZEPAM 2 MG PO TABS: 0.0000 mg | ORAL_TABLET | Freq: Four times a day (QID) | ORAL | Status: DC

## 2021-03-11 MED ORDER — ATENOLOL 25 MG PO TABS
50.0000 mg | ORAL_TABLET | Freq: Every day | ORAL | Status: DC
  Administered 2021-03-11: 50 mg via ORAL
  Filled 2021-03-11 (×2): qty 2

## 2021-03-11 MED ORDER — DOXEPIN HCL 25 MG PO CAPS
25.0000 mg | ORAL_CAPSULE | Freq: Every day | ORAL | Status: DC
  Administered 2021-03-11 – 2021-03-16 (×6): 25 mg via ORAL
  Filled 2021-03-11 (×6): qty 1

## 2021-03-11 MED ORDER — LACTATED RINGERS IV BOLUS: 1000.0000 mL | Freq: Once | INTRAVENOUS | Status: DC

## 2021-03-11 MED ORDER — SIMVASTATIN 20 MG PO TABS
20.0000 mg | ORAL_TABLET | Freq: Every day | ORAL | Status: DC
  Administered 2021-03-11 – 2021-03-15 (×5): 20 mg via ORAL
  Filled 2021-03-11 (×5): qty 1

## 2021-03-11 MED ORDER — BUPROPION HCL ER (XL) 150 MG PO TB24
300.0000 mg | ORAL_TABLET | Freq: Every day | ORAL | Status: DC
  Administered 2021-03-11 – 2021-03-16 (×6): 300 mg via ORAL
  Filled 2021-03-11: qty 2
  Filled 2021-03-11 (×4): qty 1
  Filled 2021-03-11: qty 2

## 2021-03-11 MED ORDER — CHLORDIAZEPOXIDE HCL 25 MG PO CAPS: 25.0000 mg | ORAL_CAPSULE | ORAL | Status: DC

## 2021-03-11 MED ORDER — MORPHINE SULFATE (PF) 4 MG/ML IV SOLN
4.0000 mg | Freq: Once | INTRAVENOUS | Status: AC
  Administered 2021-03-11: 4 mg via INTRAVENOUS
  Filled 2021-03-11: qty 1

## 2021-03-11 MED ORDER — THIAMINE HCL 100 MG PO TABS
100.0000 mg | ORAL_TABLET | Freq: Every day | ORAL | Status: DC
  Administered 2021-03-11 – 2021-03-16 (×6): 100 mg via ORAL
  Filled 2021-03-11 (×6): qty 1

## 2021-03-11 MED ORDER — HYDRALAZINE HCL 20 MG/ML IJ SOLN: 5.0000 mg | INTRAMUSCULAR | Status: DC | PRN

## 2021-03-11 MED ORDER — MIDAZOLAM HCL 5 MG/5ML IJ SOLN
4.0000 mg | Freq: Once | INTRAMUSCULAR | Status: AC
  Administered 2021-03-11: 4 mg via INTRAVENOUS
  Filled 2021-03-11: qty 5

## 2021-03-11 MED ORDER — DIAZEPAM 5 MG/ML IJ SOLN
5.0000 mg | INTRAMUSCULAR | Status: DC | PRN
  Administered 2021-03-11 – 2021-03-12 (×5): 5 mg via INTRAVENOUS
  Filled 2021-03-11 (×5): qty 2

## 2021-03-11 MED ORDER — LACTATED RINGERS IV SOLN: INTRAVENOUS | Status: DC

## 2021-03-11 MED ORDER — INSULIN ASPART 100 UNIT/ML IJ SOLN
0.0000 [IU] | Freq: Three times a day (TID) | INTRAMUSCULAR | Status: DC
  Administered 2021-03-12: 7 [IU] via SUBCUTANEOUS
  Administered 2021-03-12: 4 [IU] via SUBCUTANEOUS
  Administered 2021-03-12: 15 [IU] via SUBCUTANEOUS
  Filled 2021-03-11 (×3): qty 1

## 2021-03-11 MED ORDER — PANTOPRAZOLE SODIUM 40 MG IV SOLR
40.0000 mg | Freq: Two times a day (BID) | INTRAVENOUS | Status: DC
  Administered 2021-03-14 – 2021-03-16 (×4): 40 mg via INTRAVENOUS
  Filled 2021-03-11 (×4): qty 40

## 2021-03-11 MED ORDER — CLONIDINE HCL 0.1 MG PO TABS
0.1000 mg | ORAL_TABLET | Freq: Two times a day (BID) | ORAL | Status: DC
  Administered 2021-03-11 – 2021-03-12 (×2): 0.1 mg via ORAL
  Filled 2021-03-11 (×3): qty 1

## 2021-03-11 MED ORDER — SODIUM CHLORIDE 0.9 % IV SOLN
1.0000 g | Freq: Once | INTRAVENOUS | Status: AC
  Administered 2021-03-11: 1 g via INTRAVENOUS
  Filled 2021-03-11: qty 10

## 2021-03-11 MED ORDER — CHLORDIAZEPOXIDE HCL 25 MG PO CAPS
25.0000 mg | ORAL_CAPSULE | Freq: Once | ORAL | Status: AC
  Administered 2021-03-11: 25 mg via ORAL
  Filled 2021-03-11: qty 1

## 2021-03-11 MED ORDER — MORPHINE SULFATE (PF) 2 MG/ML IV SOLN
2.0000 mg | INTRAVENOUS | Status: DC | PRN
  Administered 2021-03-11 – 2021-03-14 (×10): 2 mg via INTRAVENOUS
  Filled 2021-03-11 (×11): qty 1

## 2021-03-11 MED ORDER — INSULIN REGULAR(HUMAN) IN NACL 100-0.9 UT/100ML-% IV SOLN
INTRAVENOUS | Status: DC
  Administered 2021-03-11: 12 [IU]/h via INTRAVENOUS
  Administered 2021-03-11: 1.7 [IU]/h via INTRAVENOUS
  Administered 2021-03-11: 3.6 [IU]/h via INTRAVENOUS
  Administered 2021-03-11: 19 [IU]/h via INTRAVENOUS
  Administered 2021-03-11: 11 [IU]/h via INTRAVENOUS
  Filled 2021-03-11: qty 100

## 2021-03-11 MED ORDER — LORAZEPAM 2 MG/ML IJ SOLN
1.0000 mg | Freq: Once | INTRAMUSCULAR | Status: DC
  Filled 2021-03-11: qty 1

## 2021-03-11 MED ORDER — LORAZEPAM 2 MG/ML IJ SOLN: 0.0000 mg | Freq: Four times a day (QID) | INTRAMUSCULAR | Status: DC

## 2021-03-11 MED ORDER — POTASSIUM CHLORIDE 10 MEQ/100ML IV SOLN
10.0000 meq | INTRAVENOUS | Status: AC
  Administered 2021-03-11: 10 meq via INTRAVENOUS
  Filled 2021-03-11: qty 100

## 2021-03-11 MED ORDER — PANTOPRAZOLE INFUSION (NEW) - SIMPLE MED
80.0000 mg | Freq: Once | INTRAVENOUS | Status: AC
  Administered 2021-03-11: 80 mg via INTRAVENOUS
  Filled 2021-03-11: qty 100

## 2021-03-11 MED ORDER — INSULIN REGULAR(HUMAN) IN NACL 100-0.9 UT/100ML-% IV SOLN: INTRAVENOUS | Status: DC

## 2021-03-11 MED ORDER — LACTATED RINGERS IV BOLUS
1000.0000 mL | Freq: Once | INTRAVENOUS | Status: AC
  Administered 2021-03-11: 1000 mL via INTRAVENOUS

## 2021-03-11 MED ORDER — INSULIN GLARGINE-YFGN 100 UNIT/ML ~~LOC~~ SOLN
20.0000 [IU] | SUBCUTANEOUS | Status: DC
  Administered 2021-03-11 – 2021-03-12 (×2): 20 [IU] via SUBCUTANEOUS
  Filled 2021-03-11 (×2): qty 0.2

## 2021-03-11 MED ORDER — LORAZEPAM 2 MG PO TABS: 0.0000 mg | ORAL_TABLET | Freq: Two times a day (BID) | ORAL | Status: DC

## 2021-03-11 MED ORDER — LORAZEPAM 2 MG/ML IJ SOLN: 0.0000 mg | Freq: Two times a day (BID) | INTRAMUSCULAR | Status: DC

## 2021-03-11 MED ORDER — CHLORDIAZEPOXIDE HCL 25 MG PO CAPS
25.0000 mg | ORAL_CAPSULE | Freq: Three times a day (TID) | ORAL | Status: AC
  Administered 2021-03-11 – 2021-03-13 (×7): 25 mg via ORAL
  Filled 2021-03-11 (×7): qty 1

## 2021-03-11 MED ORDER — CHLORHEXIDINE GLUCONATE CLOTH 2 % EX PADS
6.0000 | MEDICATED_PAD | Freq: Every day | CUTANEOUS | Status: DC
  Administered 2021-03-11 – 2021-03-16 (×4): 6 via TOPICAL

## 2021-03-11 MED ORDER — DIAZEPAM 5 MG/ML IJ SOLN
5.0000 mg | INTRAMUSCULAR | Status: DC | PRN
  Administered 2021-03-11: 5 mg via INTRAVENOUS
  Filled 2021-03-11: qty 2

## 2021-03-11 MED ORDER — THIAMINE HCL 100 MG/ML IJ SOLN: 100.0000 mg | Freq: Every day | INTRAMUSCULAR | Status: DC

## 2021-03-11 MED ORDER — PAROXETINE HCL 20 MG PO TABS
40.0000 mg | ORAL_TABLET | Freq: Every day | ORAL | Status: DC
  Administered 2021-03-11 – 2021-03-16 (×6): 40 mg via ORAL
  Filled 2021-03-11 (×6): qty 2

## 2021-03-11 MED ORDER — SODIUM CHLORIDE 0.9 % IV SOLN: INTRAVENOUS | Status: DC
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
## 2021-03-11 NOTE — ED Notes (Signed)
GI at pt bedside at this time.

## 2021-03-11 NOTE — Plan of Care (Signed)
Dr. Informed of critical lactic - Greater than 9.

## 2021-03-11 NOTE — ED Notes (Signed)
Informed RN bed assigned 
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
## 2021-03-11 NOTE — ED Triage Notes (Signed)
Pt comes into the ED via [facility] from home c/o abd pain with a burning sensation.  Pt also presents with N/V.  Pt admits that he is DM but hasnt taken his insulin since Sunday.  CBG 385 today with EMS.  Pt also admits he is an alcoholic and normally drinks about 1 pint per day.  Last drink this morning around 4:00am.  Pt states he is already having DT's.  Pt also states he started coughing up blood this morning.  Pt presents in NAD at this time with even and unlabored respirations.

## 2021-03-11 NOTE — BH Assessment (Signed)
Comprehensive Clinical Assessment (CCA) Screening, Triage and Referral Note  03/11/2021 BASHEER MOLCHAN [id number]  Keena L. Monterosso, 55 year old male who presents to [facility] ED voluntarily for treatment. Per triage note, Pt comes into the ED via [facility] from home c/o abd pain with a burning sensation.  Pt also presents with N/V. Pt admits that he is DM but has not taken his insulin since Sunday.  CBG 385 today with EMS.  Pt also admits he is an alcoholic and normally drinks about 1 pint per day.  Last drink this morning around 4:00am.  Pt states he is already having DT's.  Pt also states he started coughing up blood this morning.  Pt presents in NAD with even and unlabored respirations.   During TTS assessment pt presents alert and oriented x 4, restless but cooperative, and mood-congruent with affect. The pt does not appear to be responding to internal or external stimuli. Neither is the pt presenting with any delusional thinking. Pt verified the information provided to triage RN.   Pt reports he needs inpatient treatment for alcohol use. Patient reports he has tried to quit at least 3-4 times with no success. "This time it has to work because I am scared. I was throwing up blood." Patient believes inpatient treatment would be most beneficial. Patient reports INPT hx at [facility] and he sees therapist, Leeroy Cha at [facility] in [place] for additional support. Patient states he cannot go right away because he must move in the next 6 weeks. Pt denies SI/HI/AH/VH.    TTS provided patient with local resources for inpatient treatment and suggested patient follow-up when medically clear. Patient verbalized understanding.      Chief Complaint:  Chief Complaint  Patient presents with   Emesis        Withdrawal   Abdominal Pain   Visit Diagnosis: Alcohol use disorder  Patient Reported Information How did you hear about [place]? Self  What Is the Reason for Your Visit/Call Today?  Patient reports he is not feeling well due to severe substance use(alcohol).  How Long Has This Been Causing You Problems? > than 6 months  What Do You Feel Would Help You the Most Today? Alcohol or Drug Use Treatment   Have You Recently Had Any Thoughts About Hurting Yourself? No  Are You Planning to Commit Suicide/Harm Yourself At This time? No   Have you Recently Had Thoughts About Hurting Someone Karolee Ohs? No  Are You Planning to Harm Someone at This Time? No  Explanation: No data recorded  Have You Used Any Alcohol or Drugs in the Past 24 Hours? Yes  How Long Ago Did You Use Drugs or Alcohol? No data recorded What Did You Use and How Much? Alcohol- unknown   Do You Currently Have a Therapist/Psychiatrist? Yes  Name of Therapist/Psychiatrist: Leeroy Cha at [facility] in [place].   Have You Been Recently Discharged From Any Office Practice or Programs? No  Explanation of Discharge From Practice/Program: No data recorded   CCA Screening Triage Referral Assessment Type of Contact: Face-to-Face  Telemedicine Service Delivery:   Is this Initial or Reassessment? No data recorded Date Telepsych consult ordered in [facility]:  No data recorded Time Telepsych consult ordered in [facility]:  No data recorded Location of Assessment: [facility] ED  Provider Location: [facility] ED   Collateral Involvement: None provided   Does Patient Have a Court Appointed Legal Guardian? No data recorded Name and Contact of Legal Guardian: No data recorded If  Minor and Not Living with Parent(s), Who has Custody? n/a  Is CPS involved or ever been involved? Never  Is APS involved or ever been involved? Never   Patient Determined To Be At Risk for Harm To Self or Others Based on Review of Patient Reported Information or Presenting Complaint? No  Method: No data recorded Availability of Means: No data recorded Intent: No data recorded Notification Required: No data recorded Additional Information  for Danger to Others Potential: No data recorded Additional Comments for Danger to Others Potential: No data recorded Are There Guns or Other Weapons in Your Home? No data recorded Types of Guns/Weapons: No data recorded Are These Weapons Safely Secured?                            No data recorded Who Could Verify You Are Able To Have These Secured: No data recorded Do You Have any Outstanding Charges, Pending Court Dates, Parole/Probation? No data recorded Contacted To Inform of Risk of Harm To Self or Others: No data recorded  Does Patient Present under Involuntary Commitment? No  IVC Papers Initial File Date: No data recorded  [place] of Residence: [place]   Patient Currently Receiving the Following Services: Individual Therapy   Determination of Need: Urgent (48 hours)   Options For Referral: Chemical Dependency Intensive Outpatient Therapy (CDIOP); Inpatient Hospitalization; Medication Management; ED Visit   Discharge Disposition:     Clerance Lav, Counselor, LCAS-A

## 2021-03-11 NOTE — ED Notes (Signed)
Behavioral counselor at bedside
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
## 2021-03-11 NOTE — ED Notes (Signed)
RN gave report to [facility] in the CCU.

## 2021-03-12 ENCOUNTER — Encounter: Payer: Self-pay | Admitting: Gastroenterology

## 2021-03-12 ENCOUNTER — Inpatient Hospital Stay: Payer: Medicare Other | Admitting: Anesthesiology

## 2021-03-12 ENCOUNTER — Encounter
Admission: EM | Disposition: A | Discharge status: Home / Self Care | Payer: Self-pay | Source: Home / Self Care | Attending: Student in an Organized Health Care Education/Training Program

## 2021-03-12 DIAGNOSIS — F10930 Alcohol use, unspecified with withdrawal, uncomplicated: Secondary | ICD-10-CM

## 2021-03-12 DIAGNOSIS — K2101 Gastro-esophageal reflux disease with esophagitis, with bleeding: Secondary | ICD-10-CM

## 2021-03-12 DIAGNOSIS — F1010 Alcohol abuse, uncomplicated: Secondary | ICD-10-CM | POA: Diagnosis not present

## 2021-03-12 DIAGNOSIS — R7989 Other specified abnormal findings of blood chemistry: Secondary | ICD-10-CM | POA: Diagnosis not present

## 2021-03-12 DIAGNOSIS — E1110 Type 2 diabetes mellitus with ketoacidosis without coma: Secondary | ICD-10-CM | POA: Diagnosis not present

## 2021-03-12 DIAGNOSIS — E872 Acidosis, unspecified: Secondary | ICD-10-CM

## 2021-03-12 HISTORY — PX: ESOPHAGOGASTRODUODENOSCOPY: SHX5428

## 2021-03-12 LAB — CBC
HCT: 32 % — ABNORMAL LOW (ref 39.0–52.0)
HCT: 32.5 % — ABNORMAL LOW (ref 39.0–52.0)
Hemoglobin: 11.2 g/dL — ABNORMAL LOW (ref 13.0–17.0)
Hemoglobin: 11.3 g/dL — ABNORMAL LOW (ref 13.0–17.0)
MCH: 34.5 pg — ABNORMAL HIGH (ref 26.0–34.0)
MCH: 34.3 pg — ABNORMAL HIGH (ref 26.0–34.0)
MCHC: 35 g/dL (ref 30.0–36.0)
MCHC: 34.8 g/dL (ref 30.0–36.0)
MCV: 98.5 fL (ref 80.0–100.0)
MCV: 98.8 fL (ref 80.0–100.0)
Platelets: 132 10*3/uL — ABNORMAL LOW (ref 150–400)
Platelets: 126 10*3/uL — ABNORMAL LOW (ref 150–400)
RBC: 3.25 MIL/uL — ABNORMAL LOW (ref 4.22–5.81)
RBC: 3.29 MIL/uL — ABNORMAL LOW (ref 4.22–5.81)
RDW: 12.9 % (ref 11.5–15.5)
RDW: 12.9 % (ref 11.5–15.5)
WBC: 4.8 10*3/uL (ref 4.0–10.5)
WBC: 4.7 10*3/uL (ref 4.0–10.5)
nRBC: 0 % (ref 0.0–0.2)
nRBC: 0 % (ref 0.0–0.2)

## 2021-03-12 LAB — BASIC METABOLIC PANEL
Anion gap: 7 (ref 5–15)
BUN: 11 mg/dL (ref 6–20)
CO2: 28 mmol/L (ref 22–32)
Calcium: 7.3 mg/dL — ABNORMAL LOW (ref 8.9–10.3)
Chloride: 99 mmol/L (ref 98–111)
Creatinine, Ser: 0.61 mg/dL (ref 0.61–1.24)
GFR, Estimated: >60 mL/min (ref >60)
Glucose, Bld: 83 mg/dL (ref 70–99)
Potassium: 3.2 mmol/L — ABNORMAL LOW (ref 3.5–5.1)
Sodium: 134 mmol/L — ABNORMAL LOW (ref 135–145)

## 2021-03-12 LAB — GLUCOSE, CAPILLARY
Glucose-Capillary: 132 mg/dL — ABNORMAL HIGH (ref 70–99)
Glucose-Capillary: 178 mg/dL — ABNORMAL HIGH (ref 70–99)
Glucose-Capillary: 185 mg/dL — ABNORMAL HIGH (ref 70–99)
Glucose-Capillary: 246 mg/dL — ABNORMAL HIGH (ref 70–99)
Glucose-Capillary: 306 mg/dL — ABNORMAL HIGH (ref 70–99)
Glucose-Capillary: 77 mg/dL (ref 70–99)
Glucose-Capillary: 87 mg/dL (ref 70–99)

## 2021-03-12 SURGERY — EGD (ESOPHAGOGASTRODUODENOSCOPY)
Anesthesia: General

## 2021-03-12 MED ORDER — CHLORDIAZEPOXIDE HCL 25 MG PO CAPS
25.0000 mg | ORAL_CAPSULE | Freq: Two times a day (BID) | ORAL | Status: AC
  Administered 2021-03-14 – 2021-03-15 (×4): 25 mg via ORAL
  Filled 2021-03-12 (×4): qty 1

## 2021-03-12 MED ORDER — POTASSIUM CHLORIDE 10 MEQ/100ML IV SOLN
10.0000 meq | INTRAVENOUS | Status: AC
  Administered 2021-03-12 (×4): 10 meq via INTRAVENOUS
  Filled 2021-03-12 (×4): qty 100

## 2021-03-12 MED ORDER — ATENOLOL 25 MG PO TABS
25.0000 mg | ORAL_TABLET | Freq: Every day | ORAL | Status: DC
  Administered 2021-03-13 – 2021-03-14 (×2): 25 mg via ORAL
  Filled 2021-03-12 (×2): qty 1

## 2021-03-12 MED ORDER — ADULT MULTIVITAMIN W/MINERALS CH
1.0000 | ORAL_TABLET | Freq: Every day | ORAL | Status: DC
  Administered 2021-03-12 – 2021-03-16 (×5): 1 via ORAL
  Filled 2021-03-12 (×5): qty 1

## 2021-03-12 MED ORDER — LIDOCAINE 5 % EX PTCH
1.0000 | MEDICATED_PATCH | CUTANEOUS | Status: DC
  Administered 2021-03-12 – 2021-03-13 (×2): 1 via TRANSDERMAL
  Filled 2021-03-12 (×5): qty 1

## 2021-03-12 MED ORDER — DIAZEPAM 5 MG/ML IJ SOLN
2.5000 mg | Freq: Four times a day (QID) | INTRAMUSCULAR | Status: AC | PRN
  Administered 2021-03-12 – 2021-03-13 (×2): 2.5 mg via INTRAVENOUS
  Filled 2021-03-12 (×2): qty 2

## 2021-03-12 MED ORDER — PROPOFOL 500 MG/50ML IV EMUL
INTRAVENOUS | Status: DC | PRN
  Administered 2021-03-12: 140 ug/kg/min via INTRAVENOUS

## 2021-03-12 MED ORDER — ACETAMINOPHEN 325 MG PO TABS
650.0000 mg | ORAL_TABLET | Freq: Two times a day (BID) | ORAL | Status: DC | PRN
  Administered 2021-03-12 – 2021-03-16 (×2): 650 mg via ORAL
  Filled 2021-03-12 (×2): qty 2

## 2021-03-12 MED ORDER — PROPOFOL 10 MG/ML IV BOLUS
INTRAVENOUS | Status: DC | PRN
  Administered 2021-03-12: 70 mg via INTRAVENOUS

## 2021-03-12 MED ORDER — CHLORDIAZEPOXIDE HCL 25 MG PO CAPS
25.0000 mg | ORAL_CAPSULE | Freq: Every day | ORAL | Status: DC
  Administered 2021-03-16: 09:00:00 25 mg via ORAL
  Filled 2021-03-12: qty 1

## 2021-03-12 MED ORDER — LORAZEPAM 2 MG/ML IJ SOLN: 1.0000 mg | INTRAMUSCULAR | Status: AC | PRN

## 2021-03-12 MED ORDER — LIDOCAINE HCL (CARDIAC) PF 100 MG/5ML IV SOSY
PREFILLED_SYRINGE | INTRAVENOUS | Status: DC | PRN
  Administered 2021-03-12: 60 mg via INTRAVENOUS

## 2021-03-12 MED ORDER — LORAZEPAM 1 MG PO TABS: 1.0000 mg | ORAL_TABLET | ORAL | Status: AC | PRN

## 2021-03-12 MED ORDER — CALCIUM CARBONATE ANTACID 500 MG PO CHEW
400.0000 mg | CHEWABLE_TABLET | ORAL | Status: DC | PRN
  Filled 2021-03-12: qty 2

## 2021-03-12 MED ORDER — SUCRALFATE 1 GM/10ML PO SUSP
1.0000 g | Freq: Three times a day (TID) | ORAL | Status: DC
  Administered 2021-03-12 – 2021-03-16 (×16): 1 g via ORAL
  Filled 2021-03-12 (×19): qty 10

## 2021-03-12 NOTE — Op Note (Signed)
[facility] Gastroenterology Patient Name: Jacob Frederick Procedure Date: 03/12/2021 12:04 PM MRN: [id number] Account #: [id number] Date of Birth: 21-Jan-1966 Admit Type: Inpatient Age: 55 Room: [facility] ENDO ROOM 3 Gender: Male Note Status: Finalized Instrument Name: Upper Endoscope [id number] Procedure:             Upper GI endoscopy Indications:           Hematemesis Providers:             Andrey Farmer MD, MD Referring MD:          Patricia Pesa. Redmond Pulling (Referring MD) Medicines:             Monitored Anesthesia Care Complications:         No immediate complications. Procedure:             Pre-Anesthesia Assessment:                        - Prior to the procedure, a History and Physical was                         performed, and patient medications and allergies were                         reviewed. The patient is competent. The risks and                         benefits of the procedure and the sedation options and                         risks were discussed with the patient. All questions                         were answered and informed consent was obtained.                         Patient identification and proposed procedure were                         verified by the physician, the nurse, the anesthetist                         and the technician in the endoscopy suite. Mental                         Status Examination: alert and oriented. Airway                         Examination: normal oropharyngeal airway and neck                         mobility. Respiratory Examination: clear to                         auscultation. CV Examination: normal. Prophylactic                         Antibiotics: The patient does not require prophylactic  antibiotics. Prior Anticoagulants: The patient has                         taken no previous anticoagulant or antiplatelet                         agents. ASA Grade Assessment: III - A patient with                          severe systemic disease. After reviewing the risks and                         benefits, the patient was deemed in satisfactory                         condition to undergo the procedure. The anesthesia                         plan was to use monitored anesthesia care (MAC).                         Immediately prior to administration of medications,                         the patient was re-assessed for adequacy to receive                         sedatives. The heart rate, respiratory rate, oxygen                         saturations, blood pressure, adequacy of pulmonary                         ventilation, and response to care were monitored                         throughout the procedure. The physical status of the                         patient was re-assessed after the procedure.                        After obtaining informed consent, the endoscope was                         passed under direct vision. Throughout the procedure,                         the patient's blood pressure, pulse, and oxygen                         saturations were monitored continuously. The Endoscope                         was introduced through the mouth, and advanced to the                         second part of duodenum. The upper GI endoscopy was  accomplished without difficulty. The patient tolerated                         the procedure well. Findings:      There is no endoscopic evidence of varices in the entire esophagus.      LA Grade C (one or more mucosal breaks continuous between tops of 2 or       more mucosal folds, less than 75% circumference) esophagitis with       bleeding was found.      A 1 mm non-bleeding Mallory-Weiss tear with stigmata of recent bleeding       was found.      Patchy severe inflammation characterized by erythema was found in the       entire examined stomach.      Patchy moderately erythematous mucosa without active bleeding  and with       no stigmata of bleeding was found in the duodenal bulb. Impression:            - LA Grade C esophagitis with bleeding.                        - Mallory-Weiss tear.                        - Gastritis.                        - Erythematous duodenopathy.                        - No specimens collected. Recommendation:        - Return patient to hospital ward for ongoing care.                        - Advance diet as tolerated.                        - Continue present medications. PPI PO BID is fine.                        - Repeat upper endoscopy in 3 months to check healing.                        - Return to referring physician as previously                         scheduled. Procedure Code(s):     --- Professional ---                        [id number], Esophagogastroduodenoscopy, flexible,                         transoral; diagnostic, including collection of                         specimen(s) by brushing or washing, when performed                         (separate procedure) Diagnosis Code(s):     --- Professional ---  K20.91, Esophagitis, unspecified with bleeding                        K22.6, Gastro-esophageal laceration-hemorrhage syndrome                        K29.70, Gastritis, unspecified, without bleeding                        K31.89, Other diseases of stomach and duodenum                        K92.0, Hematemesis CPT copyright 2019 American Medical Association. All rights reserved. The codes documented in this report are preliminary and upon coder review may  be revised to meet current compliance requirements. Andrey Farmer MD, MD 03/12/2021 12:33:10 PM Number of Addenda: 0 Note Initiated On: 03/12/2021 12:04 PM Estimated Blood Loss:  Estimated blood loss: none.      [facility]

## 2021-03-12 NOTE — Care Plan (Signed)
EGD with esophagitis and small mallory weiss tear that did not require intervention. Can have a solid food diet. Can switch PPI to PO BID. Will need alcohol cessation counseling. Will need repeat EGD in 3 months to assess healing. GI will sign-off. Please re-consult if any issues.  Merlyn Lot MD, MPH [facility] GI

## 2021-03-12 NOTE — Progress Notes (Signed)
Pt A&OX4 VSS. PRN anti-anxiety and pain medication administered. No vomiting this shift. Rm air. To and from endoscopy. Tolerating diet.

## 2021-03-12 NOTE — Progress Notes (Addendum)
Inpatient Diabetes Program Recommendations  AACE/ADA: New Consensus Statement on Inpatient Glycemic Control   Target Ranges:  Prepandial:   less than 140 mg/dL      Peak postprandial:   less than 180 mg/dL (1-2 hours)      Critically ill patients:  140 - 180 mg/dL    Latest Reference Range & Units 03/12/21 07:30 03/12/21 11:20  Glucose-Capillary 70 - 99 mg/dL 77 87    Latest Reference Range & Units 03/12/21 00:13 03/12/21 01:05 03/12/21 02:07  Glucose-Capillary 70 - 99 mg/dL 010 (H) 932 (H) 355 (H)    Latest Reference Range & Units 03/11/21 11:34  CO2 22 - 32 mmol/L 14 (L)  Glucose 70 - 99 mg/dL 732 (H)  Anion gap 5 - 15  28 (H)    Latest Reference Range & Units 09/24/20 17:27 03/11/21 15:44  Hemoglobin A1C 4.8 - 5.6 % 8.9 (H) 8.8 (H)   (L): Data is abnormally low (H): Data is abnormally high  Review of Glycemic Control  Diabetes history: DM2 Outpatient Diabetes medications: Lantus 30 units daily, Metformin 1000 mg BID, Glipizide 10 mg daily Current orders for Inpatient glycemic control: Semglee 20 units Q24H, Novolog 0-20 units AC&HS  Recommendation: Please consider decreasing Semglee to 17 units Q24H.  NOTE: Noted consult for diabetes coordinator for "medication noncompliance, DKA."   Patient was last inpatient 09/24/20-09/27/20 for DKA and ETOH withdrawal. Inpatient diabetes coordinator spoke with patient at length on 09/25/20 during last hospitalization. Unfortunately, ETOH use likely prevents patient from managing his diabetes well and is cause of medication noncompliance. Per H&P, patient admitted on 03/11/21 again with DKA and ETOH abuse and withdrawal.  Initial glucose 438 mg/dl and patient was started on IV insulin. Patient has been transition from IV to SQ insulin and received Semglee 20 units at 23:25 on 03/11/21. Patient is currently NPO and will have EGD today. Will plan to follow up with patient today.  Addendum 03/12/21@11 :30-Spoke with patient at bedside regarding DM  management. Patient stated he knew how to take all meds and what to do to take care of himself but he has been drinking alcohol heavily and not taking care of himself or taking his medications.  Patient reports he has not been checking CBGs very often but notes that when he does take his medications his glucose is usually under 200 mg/dl.  Patient reports several emotional stressors (series of events since 2018) which he states are why he started drinking alcohol.  Patient states that he takes medication for depression but he has not taken that consistently lately either.  Patient states he has monthly pill boxes that he puts his medications in and he knows he has to get back on track with taking care of himself. Patient reports that he goes to [place] for health care and that he has all DM medications and testing supplies at home. Patient reports that he is taking Lantus 30 units daily (was increased from 25 to 30 units [facility] last [facility] months ago), Metformin 1000 mg BID, and Glipizide 10 mg daily. Discussed A1C results (8.8% on 03/11/21) and explained that current A1C indicates an average glucose of 206 mg/dl over the past 2-3 months. Discussed glucose and A1C goals. Discussed importance of checking CBGs and maintaining good CBG control to prevent long-term and short-term complications. Discussed impact of nutrition, exercise, stress, sickness, and medications on diabetes control.  Patient reports that he knows how to follow a carb modified diet but notes that he has  not been eating much over the past few weeks/months as drinking alcohol suppresses his appetite. Encouraged patient to work towards getting back in the habit of checking glucose consistently, taking DM medications as prescribed, and [facility] regarding [facility] and other chronic issues.  Patient verbalized understanding of information discussed and reports no further questions at this time related to diabetes.   Thanks, Orlando Penner, RN, MSN,  CDE Diabetes Coordinator Inpatient Diabetes Program [phone] (Team Pager from 8am to 5pm)

## 2021-03-12 NOTE — Transfer of Care (Signed)
Immediate Anesthesia Transfer of Care Note  Patient: NETTIE CROMWELL  Procedure(s) Performed: ESOPHAGOGASTRODUODENOSCOPY (EGD)  Patient Location: PACU  Anesthesia Type:General  Level of Consciousness: awake, alert  and oriented  Airway & Oxygen Therapy: Patient Spontanous Breathing  Post-op Assessment: Report given to RN and Post -op Vital signs reviewed and stable  Post vital signs: Reviewed and stable  Last Vitals:  Vitals Value Taken Time  BP 90/64 03/12/21 1229  Temp    Pulse 61 03/12/21 1230  Resp 11 03/12/21 1230  SpO2 91 % 03/12/21 1230  Vitals shown include unvalidated device data.  Last Pain:  Vitals:   03/12/21 1136  TempSrc: Oral  PainSc:          Complications: No notable events documented.

## 2021-03-12 NOTE — Anesthesia Preprocedure Evaluation (Signed)
Anesthesia Evaluation  Patient identified by MRN, date of birth, ID band Patient awake    Airway Mallampati: III  TM Distance: <3 FB     Dental  (+) Teeth Intact   Pulmonary COPD, former smoker,     + decreased breath sounds      Cardiovascular Exercise Tolerance: Good hypertension, Pt. on medications  Rhythm:Regular     Neuro/Psych Depression    GI/Hepatic Neg liver ROS, GERD  ,  Endo/Other  diabetes, Type 1  Renal/GU negative Renal ROS  negative genitourinary   Musculoskeletal  (+) Arthritis ,   Abdominal (+) + obese,   Peds negative pediatric ROS (+)  Hematology   Anesthesia Other Findings   Reproductive/Obstetrics                             Anesthesia Physical Anesthesia Plan  ASA: 3  Anesthesia Plan: General   Post-op Pain Management:    Induction:   PONV Risk Score and Plan:   Airway Management Planned: Nasal Cannula  Additional Equipment:   Intra-op Plan:   Post-operative Plan:   Informed Consent: I have reviewed the patients History and Physical, chart, labs and discussed the procedure including the risks, benefits and alternatives for the proposed anesthesia with the patient or authorized representative who has indicated his/her understanding and acceptance.       Plan Discussed with: CRNA and Surgeon  Anesthesia Plan Comments:         Anesthesia Quick Evaluation

## 2021-03-12 NOTE — Progress Notes (Addendum)
PROGRESS NOTE  Jacob Frederick    DOB: March 07, 1966, 55 y.o.  EXH:[id number]  PCP: [facility]   Code Status: Full Code   DOA: 03/11/2021   LOS: 1  Brief Narrative of Current Hospitalization  Jacob Frederick is a 55 y.o. male with a PMH significant for significant alcohol dependence, type II DM, HTN, HLD, GERD, depression, Lyme's disease, chronic back pain, DDD. They presented from home to the ED on 03/11/2021 with N/V/abdominal pain with hematemesis x1days. In the ED, it was found that they had GI bleed, DKA, alcohol withdrawal. They were treated with IV fluid support, Librium, insulin drip, Protonix drip.  Patient was admitted to medicine service for further workup and management of GI bleed, DKA as outlined in detail below.  03/12/21 -improved  Assessment & Plan  Principal Problem:   DKA (diabetic ketoacidosis) (HCC) Active Problems:   Alcohol abuse   Alcohol withdrawal (HCC)   Diabetes mellitus without complication (HCC)   Hypertension   Depression   Abnormal LFTs   GERD (gastroesophageal reflux disease)   Hematemesis   Lactic acidosis  GI bleed with hematemesis-patient is without further bleeding since admission.  Fortunately, hemoglobin has remained relatively stable at 11.3 today.  Vital signs showing low normal blood pressures. -GI following, appreciate recommendations  - EGD 11/23-showed no varices.  Positive for esophagitis with bleeding as well as a 1 mm nonbleeding Mallory-Weiss tear with stigmata of recent bleeding.  Gastritis also present without active bleeding. -SBP ppx -Maintain 2 large-bore PIV's with maintenance IV fluids -PPI twice daily -Repeat EGD in 3 months  DKA-resolved Type II DM -Continue Lantus and sliding scale -Continue simvastatin  Alcohol dependence- -CIWA monitoring with as needed Ativan -slow Librium taper -Continue thiamine, folate, multivitamin  HTN-holding home medications due to low blood pressures.  With the exception of  atenolol for the benefits with high-pressure GI bleed. 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  GERD -On PPI  DVT prophylaxis: SCDs Start: 03/11/21 1418  Diet:  Diet Orders (From admission, onward)     Start     Ordered   03/11/21 2258  Diet heart healthy/carb modified Room service appropriate? Yes; Fluid consistency: Thin  Diet effective now       Question Answer Comment  Diet-HS Snack? Nothing   Room service appropriate? Yes   Fluid consistency: Thin      03/11/21 2259            Subjective 03/12/21    Pt reports feeling some esophageal pain. Denies any more hematemesis.   Disposition Plan & Communication  Patient status: Inpatient  Admitted From: Home Disposition: Home Anticipated discharge date: 11/25  Family Communication: none  Consults, Procedures, Significant Events  Consultants:  GI  Procedures/significant events:  11/23- EGD Antimicrobials:  Anti-infectives (From admission, onward)    Start     Dose/Rate Route Frequency Ordered Stop   03/11/21 1345  cefTRIAXone (ROCEPHIN) 1 g in sodium chloride 0.9 % 100 mL IVPB        1 g 200 mL/hr over 30 Minutes Intravenous  Once 03/11/21 1336 03/11/21 1511       Objective   Vitals:   03/12/21 0300 03/12/21 0400 03/12/21 0500 03/12/21 0600  BP: 97/75 102/76 101/72 92/68  Pulse: (!) 58 (!) 59 (!) 57 (!) 56  Resp: 12 12 12 12   Temp:  98.2 F (  36.8 C)    TempSrc:  Oral    SpO2: 92% 94% 94% 93%  Weight:      Height:        Intake/Output Summary (Last 24 hours) at 03/12/2021 0722 Last data filed at 03/12/2021 0600 Gross per 24 hour  Intake 1046.64 ml  Output 700 ml  Net 346.64 ml   Filed Weights   03/11/21 1059 03/11/21 1530  Weight: 99.8 kg 99.8 kg    Patient BMI: Body mass index is 28.25 kg/m.   Physical  Exam: General: awake, alert, NAD HEENT: atraumatic, clear conjunctiva, anicteric sclera, moist mucus membranes, hearing grossly normal Respiratory: normal respiratory effort. Cardiovascular: quick capillary refill  Gastrointestinal: soft, tenderness epigastric area Nervous: A&O x3. no gross focal neurologic deficits, normal speech Extremities: moves all equally, no edema, normal tone Skin: dry, intact, normal temperature, normal color, No rashes, lesions or ulcers Psychiatry: normal mood, congruent affect  Labs   I have personally reviewed following labs and imaging studies Admission on 03/11/2021  Component Date Value Ref Range Status   Sodium 03/11/2021 133 (L)  135 - 145 mmol/L Final   Potassium 03/11/2021 4.5  3.5 - 5.1 mmol/L Final   Chloride 03/11/2021 91 (L)  98 - 111 mmol/L Final   CO2 03/11/2021 14 (L)  22 - 32 mmol/L Final   Glucose, Bld 03/11/2021 438 (H)  70 - 99 mg/dL Final   BUN 70/35/0093 17  6 - 20 mg/dL Final   Creatinine, Ser 03/11/2021 0.97  0.61 - 1.24 mg/dL Final   Calcium 81/82/9937 8.4 (L)  8.9 - 10.3 mg/dL Final   Total Protein 16/96/7893 7.6  6.5 - 8.1 g/dL Final   Albumin 81/04/7508 4.2  3.5 - 5.0 g/dL Final   AST 25/85/2778 80 (H)  15 - 41 U/L Final   ALT 03/11/2021 39  0 - 44 U/L Final   Alkaline Phosphatase 03/11/2021 74  38 - 126 U/L Final   Total Bilirubin 03/11/2021 1.9 (H)  0.3 - 1.2 mg/dL Final   GFR, Estimated 03/11/2021 >60  >60 mL/min Final   Anion gap 03/11/2021 28 (H)  5 - 15 Final   WBC 03/11/2021 10.8 (H)  4.0 - 10.5 K/uL Final   RBC 03/11/2021 4.24  4.22 - 5.81 MIL/uL Final   Hemoglobin 03/11/2021 14.7  13.0 - 17.0 g/dL Final   HCT 24/23/5361 42.2  39.0 - 52.0 % Final   MCV 03/11/2021 99.5  80.0 - 100.0 fL Final   MCH 03/11/2021 34.7 (H)  26.0 - 34.0 pg Final   MCHC 03/11/2021 34.8  30.0 - 36.0 g/dL Final   RDW 44/31/5400 13.1  11.5 - 15.5 % Final   Platelets 03/11/2021 212  150 - 400 K/uL Final   nRBC 03/11/2021 0.0  0.0 - 0.2 % Final    Neutrophils Relative % 03/11/2021 84  % Final   Neutro Abs 03/11/2021 9.0 (H)  1.7 - 7.7 K/uL Final   Lymphocytes Relative 03/11/2021 8  % Final   Lymphs Abs 03/11/2021 0.8  0.7 - 4.0 K/uL Final   Monocytes Relative 03/11/2021 7  % Final   Monocytes Absolute 03/11/2021 0.8  0.1 - 1.0 K/uL Final   Eosinophils Relative 03/11/2021 0  % Final   Eosinophils Absolute 03/11/2021 0.0  0.0 - 0.5 K/uL Final   Basophils Relative 03/11/2021 0  % Final   Basophils Absolute 03/11/2021 0.0  0.0 - 0.1 K/uL Final   Immature Granulocytes 03/11/2021 1  % Final  Abs Immature Granulocytes 03/11/2021 0.14 (H)  0.00 - 0.07 K/uL Final   Magnesium 03/11/2021 1.7  1.7 - 2.4 mg/dL Final   Color, Urine 69/62/9528 YELLOW (A)  YELLOW Final   APPearance 03/11/2021 HAZY (A)  CLEAR Final   Specific Gravity, Urine 03/11/2021 1.018  1.005 - 1.030 Final   pH 03/11/2021 5.0  5.0 - 8.0 Final   Glucose, UA 03/11/2021 >=500 (A)  NEGATIVE mg/dL Final   Hgb urine dipstick 03/11/2021 NEGATIVE  NEGATIVE Final   Bilirubin Urine 03/11/2021 NEGATIVE  NEGATIVE Final   Ketones, ur 03/11/2021 20 (A)  NEGATIVE mg/dL Final   Protein, ur 41/32/4401 30 (A)  NEGATIVE mg/dL Final   Nitrite 02/72/5366 NEGATIVE  NEGATIVE Final   Leukocytes,Ua 03/11/2021 NEGATIVE  NEGATIVE Final   WBC, UA 03/11/2021 0-5  0 - 5 WBC/hpf Final   Bacteria, UA 03/11/2021 RARE (A)  NONE SEEN Final   Squamous Epithelial / LPF 03/11/2021 0-5  0 - 5 Final   Mucus 03/11/2021 PRESENT   Final   Hyaline Casts, UA 03/11/2021 PRESENT   Final   Lipase 03/11/2021 25  11 - 51 U/L Final   ABO/RH(D) 03/11/2021 O NEG   Final   Antibody Screen 03/11/2021 NEG   Final   Sample Expiration 03/11/2021    Final                   Value:03/14/2021,2359 Performed at [facility], [address] [place], [place] [address]    pH, Ven 03/11/2021 7.41  7.250 - 7.430 Final   pCO2, Ven 03/11/2021 26 (L)  44.0 - 60.0 mmHg Final   pO2, Ven 03/11/2021 43.0  32.0 - 45.0 mmHg  Final   Bicarbonate 03/11/2021 16.5 (L)  20.0 - 28.0 mmol/L Final   Acid-base deficit 03/11/2021 6.4 (H)  0.0 - 2.0 mmol/L Final   O2 Saturation 03/11/2021 79.1  % Final   Patient temperature 03/11/2021 37.0   Final   Collection site 03/11/2021 VEIN   Final   Sample type 03/11/2021 VEIN   Final   SARS Coronavirus 2 by RT PCR 03/11/2021 NEGATIVE  NEGATIVE Final   Influenza A by PCR 03/11/2021 NEGATIVE  NEGATIVE Final   Influenza B by PCR 03/11/2021 NEGATIVE  NEGATIVE Final   Beta-Hydroxybutyric Acid 03/11/2021 2.64 (H)  0.05 - 0.27 mmol/L Final   Prothrombin Time 03/11/2021 15.3 (H)  11.4 - 15.2 seconds Final   INR 03/11/2021 1.2  0.8 - 1.2 Final   Glucose-Capillary 03/11/2021 406 (H)  70 - 99 mg/dL Final   WBC 74/25/9563 8.2  4.0 - 10.5 K/uL Final   RBC 03/11/2021 3.76 (L)  4.22 - 5.81 MIL/uL Final   Hemoglobin 03/11/2021 13.2  13.0 - 17.0 g/dL Final   HCT 87/56/4332 37.0 (L)  39.0 - 52.0 % Final   MCV 03/11/2021 98.4  80.0 - 100.0 fL Final   MCH 03/11/2021 35.1 (H)  26.0 - 34.0 pg Final   MCHC 03/11/2021 35.7  30.0 - 36.0 g/dL Final   RDW 95/18/8416 12.9  11.5 - 15.5 % Final   Platelets 03/11/2021 156  150 - 400 K/uL Final   nRBC 03/11/2021 0.0  0.0 - 0.2 % Final   WBC 03/11/2021 6.1  4.0 - 10.5 K/uL Final   RBC 03/11/2021 3.31 (L)  4.22 - 5.81 MIL/uL Final   Hemoglobin 03/11/2021 11.3 (L)  13.0 - 17.0 g/dL Final   HCT 60/63/0160 32.1 (L)  39.0 - 52.0 % Final   MCV  03/11/2021 97.0  80.0 - 100.0 fL Final   MCH 03/11/2021 34.1 (H)  26.0 - 34.0 pg Final   MCHC 03/11/2021 35.2  30.0 - 36.0 g/dL Final   RDW 09/81/1914 12.9  11.5 - 15.5 % Final   Platelets 03/11/2021 127 (L)  150 - 400 K/uL Final   nRBC 03/11/2021 0.0  0.0 - 0.2 % Final   WBC 03/12/2021 4.8  4.0 - 10.5 K/uL Final   RBC 03/12/2021 3.25 (L)  4.22 - 5.81 MIL/uL Final   Hemoglobin 03/12/2021 11.2 (L)  13.0 - 17.0 g/dL Final   HCT 78/29/5621 32.0 (L)  39.0 - 52.0 % Final   MCV 03/12/2021 98.5  80.0 - 100.0 fL Final   MCH  03/12/2021 34.5 (H)  26.0 - 34.0 pg Final   MCHC 03/12/2021 35.0  30.0 - 36.0 g/dL Final   RDW 30/86/5784 12.9  11.5 - 15.5 % Final   Platelets 03/12/2021 132 (L)  150 - 400 K/uL Final   nRBC 03/12/2021 0.0  0.0 - 0.2 % Final   Lactic Acid, Venous 03/11/2021 >9.0 (HH)  0.5 - 1.9 mmol/L Final   Sodium 03/11/2021 133 (L)  135 - 145 mmol/L Final   Potassium 03/11/2021 4.6  3.5 - 5.1 mmol/L Final   Chloride 03/11/2021 95 (L)  98 - 111 mmol/L Final   CO2 03/11/2021 18 (L)  22 - 32 mmol/L Final   Glucose, Bld 03/11/2021 267 (H)  70 - 99 mg/dL Final   BUN 69/62/9528 17  6 - 20 mg/dL Final   Creatinine, Ser 03/11/2021 0.88  0.61 - 1.24 mg/dL Final   Calcium 41/32/4401 8.0 (L)  8.9 - 10.3 mg/dL Final   GFR, Estimated 03/11/2021 >60  >60 mL/min Final   Anion gap 03/11/2021 20 (H)  5 - 15 Final   Sodium 03/11/2021 132 (L)  135 - 145 mmol/L Final   Potassium 03/11/2021 4.3  3.5 - 5.1 mmol/L Final   Chloride 03/11/2021 98  98 - 111 mmol/L Final   CO2 03/11/2021 24  22 - 32 mmol/L Final   Glucose, Bld 03/11/2021 279 (H)  70 - 99 mg/dL Final   BUN 02/72/5366 15  6 - 20 mg/dL Final   Creatinine, Ser 03/11/2021 0.83  0.61 - 1.24 mg/dL Final   Calcium 44/06/4740 7.6 (L)  8.9 - 10.3 mg/dL Final   GFR, Estimated 03/11/2021 >60  >60 mL/min Final   Anion gap 03/11/2021 10  5 - 15 Final   Sodium 03/11/2021 133 (L)  135 - 145 mmol/L Final   Potassium 03/11/2021 3.7  3.5 - 5.1 mmol/L Final   Chloride 03/11/2021 97 (L)  98 - 111 mmol/L Final   CO2 03/11/2021 29  22 - 32 mmol/L Final   Glucose, Bld 03/11/2021 127 (H)  70 - 99 mg/dL Final   BUN 59/56/3875 15  6 - 20 mg/dL Final   Creatinine, Ser 03/11/2021 0.57 (L)  0.61 - 1.24 mg/dL Final   Calcium 64/33/2951 7.6 (L)  8.9 - 10.3 mg/dL Final   GFR, Estimated 03/11/2021 >60  >60 mL/min Final   Anion gap 03/11/2021 7  5 - 15 Final   Hgb A1c MFr Bld 03/11/2021 8.8 (H)  4.8 - 5.6 % Final   Mean Plasma Glucose 03/11/2021 205.86  mg/dL Final   MRSA by PCR Next  Gen 03/11/2021 NOT DETECTED  NOT DETECTED Final   Glucose-Capillary 03/11/2021 311 (H)  70 - 99 mg/dL Final   Lactic Acid, Venous 03/11/2021  6.1 (HH)  0.5 - 1.9 mmol/L Final   Lactic Acid, Venous 03/11/2021 2.2 (HH)  0.5 - 1.9 mmol/L Final   Procalcitonin 03/11/2021 <0.10  ng/mL Final   Glucose-Capillary 03/11/2021 175 (H)  70 - 99 mg/dL Final   Glucose-Capillary 03/11/2021 171 (H)  70 - 99 mg/dL Final   Glucose-Capillary 03/11/2021 296 (H)  70 - 99 mg/dL Final   Glucose-Capillary 03/11/2021 203 (H)  70 - 99 mg/dL Final   Comment 1 95/12/3265 Notify RN   Final   Comment 2 03/11/2021 Document in Chart   Final   Glucose-Capillary 03/11/2021 139 (H)  70 - 99 mg/dL Final   Glucose-Capillary 03/11/2021 116 (H)  70 - 99 mg/dL Final   Comment 1 12/45/8099 Notify RN   Final   Comment 2 03/11/2021 Document in Chart   Final   Albumin 03/11/2021 3.1 (L)  3.5 - 5.0 g/dL Final   Glucose-Capillary 03/11/2021 124 (H)  70 - 99 mg/dL Final   Comment 1 83/38/2505 Notify RN   Final   Comment 2 03/11/2021 Document in Chart   Final   Glucose-Capillary 03/12/2021 178 (H)  70 - 99 mg/dL Final   Glucose-Capillary 03/12/2021 185 (H)  70 - 99 mg/dL Final   Comment 1 39/76/7341 Notify RN   Final   Comment 2 03/12/2021 Document in Chart   Final   Glucose-Capillary 03/12/2021 132 (H)  70 - 99 mg/dL Final   Comment 1 93/79/0240 Notify RN   Final   Comment 2 03/12/2021 Document in Chart   Final    Imaging Studies  CT ABDOMEN PELVIS WO CONTRAST  Result Date: 03/11/2021 CLINICAL DATA:  Alcoholic with N/V/D. eval ascites, sbo EXAM: CT ABDOMEN AND PELVIS WITHOUT CONTRAST TECHNIQUE: Multidetector CT imaging of the abdomen and pelvis was performed following the standard protocol without IV contrast. COMPARISON:  06/09/2016 FINDINGS: Lower chest: No acute abnormality. Hepatobiliary: Hepatic steatosis. Unremarkable gallbladder. No biliary dilatation. Pancreas: Unremarkable. Spleen: Unremarkable. Adrenals/Urinary Tract:  Adrenals are unremarkable. Punctate bilateral nonobstructing renal calculi. Bladder is unremarkable. Stomach/Bowel: Stomach is within normal limits. Bowel is normal in caliber. Colonic diverticulosis. Vascular/Lymphatic: Mild aortic atherosclerosis. No enlarged lymph nodes. Reproductive: Unremarkable. Other: No ascites.  Abdominal wall is unremarkable. Musculoskeletal: Lumbar spine degenerative changes. No acute osseous abnormality. IMPRESSION: No acute abnormality. Hepatic steatosis. Punctate nonobstructing renal calculi. Colonic diverticulosis. Electronically Signed   By: Guadlupe Spanish M.D.   On: 03/11/2021 13:27   DG Chest Portable 1 View  Result Date: 03/11/2021 CLINICAL DATA:  Hemoptysis EXAM: PORTABLE CHEST 1 VIEW COMPARISON:  Chest x-ray 10/11/2016 FINDINGS: Heart size and mediastinal contours are within normal limits. No suspicious pulmonary opacities identified. No pleural effusion or pneumothorax visualized. No acute osseous abnormality appreciated. IMPRESSION: No acute intrathoracic process identified. Electronically Signed   By: Jannifer Hick M.D.   On: 03/11/2021 12:01   Medications   Scheduled Meds:  atenolol  50 mg Oral Daily   buPROPion  300 mg Oral Daily   chlordiazePOXIDE  25 mg Oral TID   Chlorhexidine Gluconate Cloth  6 each Topical Q0600   cloNIDine  0.1 mg Oral BID   doxepin  25 mg Oral Daily   folic acid  1 mg Oral Daily   insulin aspart  0-20 Units Subcutaneous TID AC & HS   insulin glargine-yfgn  20 Units Subcutaneous Q24H   [START ON 03/14/2021] pantoprazole  40 mg Intravenous Q12H   PARoxetine  40 mg Oral Daily   simvastatin  20 mg Oral  QHS   thiamine  100 mg Oral Daily   Or   thiamine  100 mg Intravenous Daily   No recently discontinued medications to reconcile  LOS: 1 day   Time spent: >4min  Leeroy Bock, DO [facility] 03/12/2021, 7:22 AM   Please refer to amion to contact the [facility] Attending or Consulting provider for this  pt  [email or web address] Available by Epic secure chat 7AM-7PM. If 7PM-7AM, please contact night-coverage

## 2021-03-13 DIAGNOSIS — F1010 Alcohol abuse, uncomplicated: Secondary | ICD-10-CM | POA: Diagnosis not present

## 2021-03-13 DIAGNOSIS — E1110 Type 2 diabetes mellitus with ketoacidosis without coma: Secondary | ICD-10-CM | POA: Diagnosis not present

## 2021-03-13 DIAGNOSIS — R7989 Other specified abnormal findings of blood chemistry: Secondary | ICD-10-CM | POA: Diagnosis not present

## 2021-03-13 DIAGNOSIS — K2101 Gastro-esophageal reflux disease with esophagitis, with bleeding: Secondary | ICD-10-CM | POA: Diagnosis not present

## 2021-03-13 LAB — BASIC METABOLIC PANEL
Anion gap: 6 (ref 5–15)
BUN: 10 mg/dL (ref 6–20)
CO2: 24 mmol/L (ref 22–32)
Calcium: 7.1 mg/dL — ABNORMAL LOW (ref 8.9–10.3)
Chloride: 103 mmol/L (ref 98–111)
Creatinine, Ser: 0.57 mg/dL — ABNORMAL LOW (ref 0.61–1.24)
GFR, Estimated: >60 mL/min (ref >60)
Glucose, Bld: 188 mg/dL — ABNORMAL HIGH (ref 70–99)
Potassium: 3.5 mmol/L (ref 3.5–5.1)
Sodium: 133 mmol/L — ABNORMAL LOW (ref 135–145)

## 2021-03-13 LAB — CBC
HCT: 33.7 % — ABNORMAL LOW (ref 39.0–52.0)
Hemoglobin: 11.4 g/dL — ABNORMAL LOW (ref 13.0–17.0)
MCH: 34.4 pg — ABNORMAL HIGH (ref 26.0–34.0)
MCHC: 33.8 g/dL (ref 30.0–36.0)
MCV: 101.8 fL — ABNORMAL HIGH (ref 80.0–100.0)
Platelets: 123 10*3/uL — ABNORMAL LOW (ref 150–400)
RBC: 3.31 MIL/uL — ABNORMAL LOW (ref 4.22–5.81)
RDW: 12.9 % (ref 11.5–15.5)
WBC: 4.2 10*3/uL (ref 4.0–10.5)
nRBC: 0 % (ref 0.0–0.2)

## 2021-03-13 LAB — GLUCOSE, CAPILLARY
Glucose-Capillary: 181 mg/dL — ABNORMAL HIGH (ref 70–99)
Glucose-Capillary: 190 mg/dL — ABNORMAL HIGH (ref 70–99)
Glucose-Capillary: 191 mg/dL — ABNORMAL HIGH (ref 70–99)
Glucose-Capillary: 249 mg/dL — ABNORMAL HIGH (ref 70–99)
Glucose-Capillary: 251 mg/dL — ABNORMAL HIGH (ref 70–99)

## 2021-03-13 MED ORDER — INSULIN GLARGINE-YFGN 100 UNIT/ML ~~LOC~~ SOLN
10.0000 [IU] | Freq: Once | SUBCUTANEOUS | Status: AC
  Administered 2021-03-13: 10 [IU] via SUBCUTANEOUS
  Filled 2021-03-13: qty 0.1

## 2021-03-13 MED ORDER — INSULIN GLARGINE-YFGN 100 UNIT/ML ~~LOC~~ SOLN
20.0000 [IU] | Freq: Every day | SUBCUTANEOUS | Status: DC
  Filled 2021-03-13: qty 0.2

## 2021-03-13 MED ORDER — INSULIN ASPART 100 UNIT/ML IJ SOLN
0.0000 [IU] | Freq: Three times a day (TID) | INTRAMUSCULAR | Status: DC
  Administered 2021-03-13: 7 [IU] via SUBCUTANEOUS
  Administered 2021-03-13 (×2): 4 [IU] via SUBCUTANEOUS
  Administered 2021-03-14: 7 [IU] via SUBCUTANEOUS
  Administered 2021-03-14 (×2): 11 [IU] via SUBCUTANEOUS
  Administered 2021-03-15: 08:00:00 7 [IU] via SUBCUTANEOUS
  Administered 2021-03-15: 17:00:00 3 [IU] via SUBCUTANEOUS
  Administered 2021-03-15 – 2021-03-16 (×3): 7 [IU] via SUBCUTANEOUS
  Filled 2021-03-13 (×9): qty 1

## 2021-03-13 MED ORDER — DIAZEPAM 2 MG PO TABS
2.0000 mg | ORAL_TABLET | Freq: Four times a day (QID) | ORAL | Status: DC | PRN
  Administered 2021-03-13 – 2021-03-16 (×7): 2 mg via ORAL
  Filled 2021-03-13 (×7): qty 1

## 2021-03-13 NOTE — Progress Notes (Signed)
Inpatient Diabetes Program Recommendations  AACE/ADA: New Consensus Statement on Inpatient Glycemic Control  Target Ranges:  Prepandial:   less than 140 mg/dL      Peak postprandial:   less than 180 mg/dL (1-2 hours)      Critically ill patients:  140 - 180 mg/dL    Latest Reference Range & Units 03/12/21 16:11 03/12/21 21:10 03/13/21 05:39 03/13/21 07:48  Glucose-Capillary 70 - 99 mg/dL 098 (H) 119 (H) 147 (H) 190 (H)  (H): Data is abnormally high  Review of Glycemic Control  Diabetes history: DM2 Outpatient Diabetes medications: Lantus 30 units daily, Metformin 1000 mg BID, Glipizide 10 mg daily Current orders for Inpatient glycemic control: Semglee 20 units daily, Novolog 0-20 units TID    Inpatient Diabetes Program Recommendations:    Insulin: Noted patient received Semglee 20 units last night at 23:49 and is ordered Semglee 20 units daily (to start 03/14/21 at 10am) and Semglee 10 units one time order for today at 10am. Fasting glucose 190 mg/dl today. Please consider discontinuing current Semglee orders and order Semglee 24 units QHS. Also, please consider ordering Novolog 6 units TID with meals for meal coverage if patient eats at least 50% of meals.  Thanks, Orlando Penner, RN, MSN, CDE Diabetes Coordinator Inpatient Diabetes Program [phone] (Team Pager from 8am to 5pm)

## 2021-03-13 NOTE — Progress Notes (Signed)
PROGRESS NOTE  Jacob Frederick    DOB: March 31, 1966, 55 y.o.  ZOX:[id number]  PCP: [facility]   Code Status: Full Code   DOA: 03/11/2021   LOS: 2  Brief Narrative of Current Hospitalization  Jacob Frederick is a 55 y.o. male with a PMH significant for significant alcohol dependence, type II DM, HTN, HLD, GERD, depression, Lyme's disease, chronic back pain, DDD. They presented from home to the ED on 03/11/2021 with N/V/abdominal pain with hematemesis x1days. In the ED, it was found that they had GI bleed, DKA, alcohol withdrawal. They were treated with IV fluid support, Librium, insulin drip, Protonix drip.  Patient was admitted to medicine service for further workup and management of GI bleed, DKA as outlined in detail below.  03/13/21 -improved  Assessment & Plan  Principal Problem:   DKA (diabetic ketoacidosis) (HCC) Active Problems:   Alcohol abuse   Alcohol withdrawal (HCC)   Diabetes mellitus without complication (HCC)   Hypertension   Depression   Abnormal LFTs   GERD (gastroesophageal reflux disease)   Hematemesis   Lactic acidosis  GI bleed with hematemesis-patient is without further bleeding since admission.  Hgb remains stable 11.3>11.4 Improved Bps.  -GI following, appreciate recommendations  - EGD 11/23-showed no varices.  Positive for esophagitis with bleeding as well as a 1 mm nonbleeding Mallory-Weiss tear with stigmata of recent bleeding.  Gastritis also present without active bleeding. -discontinue IV fluids.  -PPI twice daily -Repeat EGD in 3 months  DKA-resolved.  Type II DM - Transitioning patient's long-acting insulin to am doing tomorrow for better safety profile. Half dose this am.  -Continue sliding scale -Continue simvastatin  Alcohol dependence- -CIWA monitoring with as needed valium, as patient is unable to tolerate ativan -slow Librium taper -Continue thiamine, folate, multivitamin - TOC provided resources for OP  rehab  HTN-holding home medications due to low blood pressures.  With the exception of atenolol for the benefits with high-pressure GI bleed. 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  GERD -On PPI  DVT prophylaxis: SCDs Start: 03/11/21 1418  Diet:  Diet Orders (From admission, onward)     Start     Ordered   03/12/21 1448  Diet heart healthy/carb modified Room service appropriate? Yes; Fluid consistency: Thin  Diet effective now       Question Answer Comment  Diet-HS Snack? Nothing   Room service appropriate? Yes   Fluid consistency: Thin      03/12/21 1448            Subjective 03/13/21    Pt reports improvement. Continues to have some chest pain with dyspepsia and tremors. Overall feeling good. He endorses understanding and agreement with plan to stop drinking alcohol at discharge.  Disposition Plan & Communication  Patient status: Inpatient  Admitted From: Home Disposition: Home Anticipated discharge date: 11/25  Family Communication: none  Consults, Procedures, Significant Events  Consultants:  GI  Procedures/significant events:  11/23- EGD Antimicrobials:  Anti-infectives (From admission, onward)    Start     Dose/Rate Route Frequency Ordered Stop   03/11/21 1345  cefTRIAXone (ROCEPHIN) 1 g in sodium chloride 0.9 % 100 mL IVPB        1 g 200 mL/hr over 30 Minutes Intravenous  Once 03/11/21 1336 03/11/21 1511       Objective   Vitals:   03/13/21 0200  03/13/21 0300 03/13/21 0400 03/13/21 0500  BP: 119/83 111/74 105/73 105/73  Pulse: 68 63 60 64  Resp: 17 11 10 11   Temp:   98.2 F (36.8 C)   TempSrc:   Oral   SpO2: 96% 94% 94% 95%  Weight:      Height:        Intake/Output Summary (Last 24 hours) at 03/13/2021 0743 Last data filed at 03/13/2021 0600 Gross per 24 hour   Intake 3801.2 ml  Output 450 ml  Net 3351.2 ml    Filed Weights   03/11/21 1059 03/11/21 1530  Weight: 99.8 kg 99.8 kg    Patient BMI: Body mass index is 28.25 kg/m.   Physical Exam: General: awake, alert, NAD HEENT: atraumatic, clear conjunctiva, anicteric sclera, moist mucus membranes, hearing grossly normal Respiratory: normal respiratory effort. Cardiovascular: quick capillary refill  Gastrointestinal: soft, tenderness epigastric area, sternum Nervous: A&O x3. no gross focal neurologic deficits, normal speech Extremities: moves all equally, no edema, normal tone Skin: dry, intact, normal temperature, normal color, No rashes, lesions or ulcers Psychiatry: normal mood, congruent affect  Labs   I have personally reviewed following labs and imaging studies No results displayed because visit has over 200 results.      Imaging Studies  No results found. Medications   Scheduled Meds:  atenolol  25 mg Oral Daily   buPROPion  300 mg Oral Daily   chlordiazePOXIDE  25 mg Oral TID   [START ON 03/14/2021] chlordiazePOXIDE  25 mg Oral BID   Followed by   03/16/2021 ON 03/16/2021] chlordiazePOXIDE  25 mg Oral Daily   Chlorhexidine Gluconate Cloth  6 each Topical Q0600   doxepin  25 mg Oral Daily   folic acid  1 mg Oral Daily   insulin aspart  0-20 Units Subcutaneous TID AC & HS   insulin glargine-yfgn  20 Units Subcutaneous Q24H   lidocaine  1 patch Transdermal Q24H   multivitamin with minerals  1 tablet Oral Daily   [START ON 03/14/2021] pantoprazole  40 mg Intravenous Q12H   PARoxetine  40 mg Oral Daily   simvastatin  20 mg Oral QHS   sucralfate  1 g Oral TID WC & HS   thiamine  100 mg Oral Daily   Or   thiamine  100 mg Intravenous Daily   No recently discontinued medications to reconcile  LOS: 2 days   Time spent: >58min  21m, DO [facility] 03/13/2021, 7:43 AM   Please refer to amion to contact the [facility] Attending or Consulting provider for  this pt  [email or web address] Available by Epic secure chat 7AM-7PM. If 7PM-7AM, please contact night-coverage

## 2021-03-13 NOTE — Progress Notes (Signed)
Sent Dr. Dareen Piano secure chat and made her aware that patient is asking for valium and scores high enough on CIWA to have PRN med for withdrawal but that the valium order is no longer active and patient can not take ativan due to allergy. Waiting for response from MD.

## 2021-03-14 DIAGNOSIS — K226 Gastro-esophageal laceration-hemorrhage syndrome: Principal | ICD-10-CM

## 2021-03-14 DIAGNOSIS — K920 Hematemesis: Secondary | ICD-10-CM | POA: Diagnosis not present

## 2021-03-14 DIAGNOSIS — E1110 Type 2 diabetes mellitus with ketoacidosis without coma: Secondary | ICD-10-CM | POA: Diagnosis not present

## 2021-03-14 DIAGNOSIS — F10930 Alcohol use, unspecified with withdrawal, uncomplicated: Secondary | ICD-10-CM | POA: Diagnosis not present

## 2021-03-14 LAB — CBC
HCT: 35.9 % — ABNORMAL LOW (ref 39.0–52.0)
Hemoglobin: 12.1 g/dL — ABNORMAL LOW (ref 13.0–17.0)
MCH: 33.8 pg (ref 26.0–34.0)
MCHC: 33.7 g/dL (ref 30.0–36.0)
MCV: 100.3 fL — ABNORMAL HIGH (ref 80.0–100.0)
Platelets: 122 10*3/uL — ABNORMAL LOW (ref 150–400)
RBC: 3.58 MIL/uL — ABNORMAL LOW (ref 4.22–5.81)
RDW: 12.8 % (ref 11.5–15.5)
WBC: 5 10*3/uL (ref 4.0–10.5)
nRBC: 0 % (ref 0.0–0.2)

## 2021-03-14 LAB — COMPREHENSIVE METABOLIC PANEL
ALT: 28 U/L (ref 0–44)
AST: 58 U/L — ABNORMAL HIGH (ref 15–41)
Albumin: 3.1 g/dL — ABNORMAL LOW (ref 3.5–5.0)
Alkaline Phosphatase: 70 U/L (ref 38–126)
Anion gap: 8 (ref 5–15)
BUN: 7 mg/dL (ref 6–20)
CO2: 24 mmol/L (ref 22–32)
Calcium: 7.4 mg/dL — ABNORMAL LOW (ref 8.9–10.3)
Chloride: 103 mmol/L (ref 98–111)
Creatinine, Ser: 0.47 mg/dL — ABNORMAL LOW (ref 0.61–1.24)
GFR, Estimated: >60 mL/min (ref >60)
Glucose, Bld: 232 mg/dL — ABNORMAL HIGH (ref 70–99)
Potassium: 3.6 mmol/L (ref 3.5–5.1)
Sodium: 135 mmol/L (ref 135–145)
Total Bilirubin: 0.7 mg/dL (ref 0.3–1.2)
Total Protein: 5.7 g/dL — ABNORMAL LOW (ref 6.5–8.1)

## 2021-03-14 LAB — GLUCOSE, CAPILLARY
Glucose-Capillary: 298 mg/dL — ABNORMAL HIGH (ref 70–99)
Glucose-Capillary: 269 mg/dL — ABNORMAL HIGH (ref 70–99)
Glucose-Capillary: 203 mg/dL — ABNORMAL HIGH (ref 70–99)
Glucose-Capillary: 188 mg/dL — ABNORMAL HIGH (ref 70–99)

## 2021-03-14 MED ORDER — INSULIN GLARGINE-YFGN 100 UNIT/ML ~~LOC~~ SOLN
25.0000 [IU] | Freq: Every day | SUBCUTANEOUS | Status: DC
  Administered 2021-03-14 – 2021-03-15 (×2): 25 [IU] via SUBCUTANEOUS
  Filled 2021-03-14 (×3): qty 0.25

## 2021-03-14 NOTE — Progress Notes (Signed)
Patient arrived to unit via w/c in stable condition from ICU. Patient ambulated with unsteady gait from w/c to bed. Oriented to room. Bed alarm activated.

## 2021-03-14 NOTE — Progress Notes (Signed)
PROGRESS NOTE  Jacob Frederick    DOB: 10-03-1965, 55 y.o.  PQZ:[id number]  PCP: [facility]   Code Status: Full Code   DOA: 03/11/2021   LOS: 3  Brief Narrative of Current Hospitalization  Jacob Frederick is a 55 y.o. male with a PMH significant for significant alcohol dependence, type II DM, HTN, HLD, GERD, depression, Lyme's disease, chronic back pain, DDD. They presented from home to the ED on 03/11/2021 with N/V/abdominal pain with hematemesis x1days. In the ED, it was found that they had GI bleed, DKA, alcohol withdrawal. They were treated with IV fluid support, Librium, insulin drip, Protonix drip.  Patient was admitted to medicine service for further workup and management of GI bleed, DKA as outlined in detail below.  03/14/21 -improved  Assessment & Plan  Principal Problem:   DKA (diabetic ketoacidosis) (HCC) Active Problems:   Alcohol abuse   Alcohol withdrawal (HCC)   Diabetes mellitus without complication (HCC)   Hypertension   Depression   Abnormal LFTs   GERD (gastroesophageal reflux disease)   Hematemesis   Lactic acidosis  GI bleed with hematemesis-patient is without further bleeding since admission.  Hgb remains stable 11.3>11.4>12.1. Improved Bps.  -GI following, appreciate recommendations  - EGD 11/23-showed no varices.  Positive for esophagitis with bleeding as well as a 1 mm nonbleeding Mallory-Weiss tear with stigmata of recent bleeding.  Gastritis also present without active bleeding. -PPI twice daily -Repeat EGD in 3 months  DKA-resolved.  Type II DM - long-acting insulin to am 25 units -Continue sliding scale -Continue simvastatin  Alcohol dependence- CIWAs overall improved -CIWA monitoring with as needed valium, as patient is unable to tolerate ativan -slow Librium taper -Continue thiamine, folate, multivitamin - TOC provided resources for OP rehab  HTN-holding home medications due to low blood pressures.  With the exception of  atenolol for the benefits with high-pressure GI bleed. 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  Thrombocytopenia- platelets stable at 126>123>122. Related to acute illness/liver disease.  - CBC am  GERD -On PPI  DVT prophylaxis: SCDs Start: 03/11/21 1418  Diet:  Diet Orders (From admission, onward)     Start     Ordered   03/12/21 1448  Diet heart healthy/carb modified Room service appropriate? Yes; Fluid consistency: Thin  Diet effective now       Question Answer Comment  Diet-HS Snack? Nothing   Room service appropriate? Yes   Fluid consistency: Thin      03/12/21 1448            Subjective 03/14/21    Pt reports residual headache. Otherwise, no complaints.  Disposition Plan & Communication  Patient status: Inpatient  Admitted From: Home Disposition: Home Anticipated discharge date: 11/26  Family Communication: none  Consults, Procedures, Significant Events  Consultants:  GI  Procedures/significant events:  11/23- EGD Antimicrobials:  Anti-infectives (From admission, onward)    Start     Dose/Rate Route Frequency Ordered Stop   03/11/21 1345  cefTRIAXone (ROCEPHIN) 1 g in sodium chloride 0.9 % 100 mL IVPB        1 g 200 mL/hr over 30 Minutes Intravenous  Once 03/11/21 1336 03/11/21 1511       Objective   Vitals:   03/14/21 0200 03/14/21 0300 03/14/21 0400 03/14/21 0500  BP: 116/87  124/85   Pulse: 67 63 63 64  Resp: 15 13 15 15   Temp: 98.4 F (36.9 C)     TempSrc: Oral     SpO2: 92% 92% 91% 93%  Weight:      Height:        Intake/Output Summary (Last 24 hours) at 03/14/2021 0740 Last data filed at 03/13/2021 1631 Gross per 24 hour  Intake 776.75 ml  Output 1050 ml  Net -273.25 ml    Filed Weights    03/11/21 1059 03/11/21 1530  Weight: 99.8 kg 99.8 kg    Patient BMI: Body mass index is 28.25 kg/m.   Physical Exam: General: awake, alert, NAD HEENT: atraumatic, clear conjunctiva, anicteric sclera, moist mucus membranes, hearing grossly normal Respiratory: normal respiratory effort. Cardiovascular: quick capillary refill  Gastrointestinal: soft, tenderness epigastric area, sternum Nervous: A&O x3. no gross focal neurologic deficits, normal speech Extremities: moves all equally, no edema, normal tone Skin: dry, intact, normal temperature, normal color, No rashes, lesions or ulcers Psychiatry: normal mood, congruent affect  Labs   I have personally reviewed following labs and imaging studies CBC    Component Value Date/Time   WBC 5.0 03/14/2021 0508   RBC 3.58 (L) 03/14/2021 0508   HGB 12.1 (L) 03/14/2021 0508   HCT 35.9 (L) 03/14/2021 0508   PLT 122 (L) 03/14/2021 0508   MCV 100.3 (H) 03/14/2021 0508   MCH 33.8 03/14/2021 0508   MCHC 33.7 03/14/2021 0508   RDW 12.8 03/14/2021 0508   LYMPHSABS 0.8 03/11/2021 1134   MONOABS 0.8 03/11/2021 1134   EOSABS 0.0 03/11/2021 1134   BASOSABS 0.0 03/11/2021 1134   BMP Latest Ref Rng & Units 03/14/2021 03/13/2021 03/12/2021  Glucose 70 - 99 mg/dL 03/14/2021) 191(Y) 83  BUN 6 - 20 mg/dL 7 10 11   Creatinine 0.61 - 1.24 mg/dL 782(N) ) 5.62(Z  Sodium 135 - 145 mmol/L 135 133(L) 134(L)  Potassium 3.5 - 5.1 mmol/L 3.6 3.5 3.2(L)  Chloride 98 - 111 mmol/L 103 103 99  CO2 22 - 32 mmol/L 24 24 28   Calcium 8.9 - 10.3 mg/dL 7.4(L) 7.1(L) 7.3(L)   Imaging Studies  No results found. Medications   Scheduled Meds:  atenolol  25 mg Oral Daily   buPROPion  300 mg Oral Daily   chlordiazePOXIDE  25 mg Oral BID   Followed by   3.08(M ON 03/16/2021] chlordiazePOXIDE  25 mg Oral Daily   Chlorhexidine Gluconate Cloth  6 each Topical Q0600   doxepin  25 mg Oral Daily   folic acid  1 mg Oral Daily   insulin aspart  0-20 Units Subcutaneous TID WC    insulin glargine-yfgn  20 Units Subcutaneous Daily   lidocaine  1 patch Transdermal Q24H   multivitamin with minerals  1 tablet Oral Daily   pantoprazole  40 mg Intravenous Q12H   PARoxetine  40 mg Oral Daily   simvastatin  20 mg Oral QHS   sucralfate  1 g Oral TID WC & HS   thiamine  100 mg Oral Daily   Or   thiamine  100 mg Intravenous Daily   No recently discontinued medications to reconcile  LOS: 3 days   Time spent: >52min  Melene Muller, DO [facility] 03/14/2021, 7:40 AM   Please refer to amion to contact the [employer] Attending or Consulting provider for this pt  [email or web address] Available by Epic secure chat 7AM-7PM. If 7PM-7AM, please contact night-coverage

## 2021-03-14 NOTE — TOC Initial Note (Signed)
Transition of Care [facility]) - Initial/Assessment Note    Patient Details  Name: Jacob Frederick MRN: [id number] Date of Birth: 1966-01-01  Transition of Care [facility]) CM/SW Contact:    Allayne Butcher, RN Phone Number: 03/14/2021, 3:42 PM  Clinical Narrative:                 [facility] acknowledges consult for substance abuse resources.  Patient has already been seen by behavioral health counselor and was provided with resources that he can follow up on after discharge from the hospital.          Patient Goals and CMS Choice        Expected Discharge Plan and Services                                                Prior Living Arrangements/Services                       Activities of Daily Living      Permission Sought/Granted                  Emotional Assessment              Admission diagnosis:  Alcoholic ketoacidosis [E87.29] DKA (diabetic ketoacidosis) (HCC) [E11.10] Diabetic ketoacidosis without coma associated with type 2 diabetes mellitus (HCC) [E11.10] Hematemesis with nausea [K92.0] Patient Active Problem List   Diagnosis Date Noted   Mallory-Weiss tear    Lactic acidosis 03/11/2021   Diabetes mellitus without complication (HCC)    Hypertension    Depression    Abnormal LFTs    GERD (gastroesophageal reflux disease)    Hematemesis    DKA (diabetic ketoacidosis) (HCC) 09/24/2020   Alcohol abuse 09/24/2020   Alcohol withdrawal (HCC) 09/24/2020   Severe recurrent major depression without psychotic features (HCC) 09/24/2020   PCP:  [facility]:   [facility] - [place], [place] - [address] [place] [address] Phone: [phone] Fax: [phone]     Social Determinants of Health (SDOH) Interventions    Readmission Risk Interventions No flowsheet data found.

## 2021-03-15 DIAGNOSIS — E1110 Type 2 diabetes mellitus with ketoacidosis without coma: Secondary | ICD-10-CM | POA: Diagnosis not present

## 2021-03-15 DIAGNOSIS — F10939 Alcohol use, unspecified with withdrawal, unspecified: Secondary | ICD-10-CM | POA: Diagnosis not present

## 2021-03-15 DIAGNOSIS — K920 Hematemesis: Secondary | ICD-10-CM | POA: Diagnosis not present

## 2021-03-15 DIAGNOSIS — R7989 Other specified abnormal findings of blood chemistry: Secondary | ICD-10-CM | POA: Diagnosis not present

## 2021-03-15 LAB — COMPREHENSIVE METABOLIC PANEL
ALT: 31 U/L (ref 0–44)
AST: 52 U/L — ABNORMAL HIGH (ref 15–41)
Albumin: 2.8 g/dL — ABNORMAL LOW (ref 3.5–5.0)
Alkaline Phosphatase: 67 U/L (ref 38–126)
Anion gap: 6 (ref 5–15)
BUN: 7 mg/dL (ref 6–20)
CO2: 28 mmol/L (ref 22–32)
Calcium: 7.9 mg/dL — ABNORMAL LOW (ref 8.9–10.3)
Chloride: 103 mmol/L (ref 98–111)
Creatinine, Ser: 0.54 mg/dL — ABNORMAL LOW (ref 0.61–1.24)
GFR, Estimated: >60 mL/min (ref >60)
Glucose, Bld: 202 mg/dL — ABNORMAL HIGH (ref 70–99)
Potassium: 3.7 mmol/L (ref 3.5–5.1)
Sodium: 137 mmol/L (ref 135–145)
Total Bilirubin: 0.8 mg/dL (ref 0.3–1.2)
Total Protein: 5.3 g/dL — ABNORMAL LOW (ref 6.5–8.1)

## 2021-03-15 LAB — GLUCOSE, CAPILLARY
Glucose-Capillary: 224 mg/dL — ABNORMAL HIGH (ref 70–99)
Glucose-Capillary: 206 mg/dL — ABNORMAL HIGH (ref 70–99)
Glucose-Capillary: 126 mg/dL — ABNORMAL HIGH (ref 70–99)
Glucose-Capillary: 148 mg/dL — ABNORMAL HIGH (ref 70–99)

## 2021-03-15 MED ORDER — AMLODIPINE BESYLATE 10 MG PO TABS: 10.0000 mg | ORAL_TABLET | Freq: Every day | ORAL | Status: DC

## 2021-03-15 MED ORDER — GLIPIZIDE 10 MG PO TABS
10.0000 mg | ORAL_TABLET | Freq: Every day | ORAL | Status: DC
  Administered 2021-03-15 – 2021-03-16 (×2): 10 mg via ORAL
  Filled 2021-03-15 (×4): qty 1

## 2021-03-15 MED ORDER — ATENOLOL 50 MG PO TABS
50.0000 mg | ORAL_TABLET | Freq: Every day | ORAL | Status: DC
  Administered 2021-03-15 – 2021-03-16 (×2): 50 mg via ORAL
  Filled 2021-03-15 (×2): qty 1

## 2021-03-15 MED ORDER — INSULIN GLARGINE-YFGN 100 UNIT/ML ~~LOC~~ SOLN
30.0000 [IU] | Freq: Every day | SUBCUTANEOUS | Status: DC
  Administered 2021-03-16: 09:00:00 30 [IU] via SUBCUTANEOUS
  Filled 2021-03-15 (×2): qty 0.3

## 2021-03-15 MED ORDER — METFORMIN HCL 500 MG PO TABS
1000.0000 mg | ORAL_TABLET | Freq: Every day | ORAL | Status: DC
  Administered 2021-03-15 – 2021-03-16 (×2): 1000 mg via ORAL
  Filled 2021-03-15 (×4): qty 2

## 2021-03-15 NOTE — Progress Notes (Signed)
PROGRESS NOTE  Jacob Frederick    DOB: 24-Jan-1966, 55 y.o.  ZDG:[id number]  PCP: [facility]   Code Status: Full Code   DOA: 03/11/2021   LOS: 4  Brief Narrative of Current Hospitalization  Jacob Frederick is a 55 y.o. male with a PMH significant for significant alcohol dependence, type II DM, HTN, HLD, GERD, depression, Lyme's disease, chronic back pain, DDD. They presented from home to the ED on 03/11/2021 with N/V/abdominal pain with hematemesis x1days. In the ED, it was found that they had GI bleed, DKA, alcohol withdrawal. They were treated with IV fluid support, Librium, insulin drip, Protonix drip.  Patient was admitted to medicine service for further workup and management of GI bleed, DKA as outlined in detail below.  03/15/21 -improved  Assessment & Plan  Principal Problem:   DKA (diabetic ketoacidosis) (HCC) Active Problems:   Alcohol abuse   Alcohol withdrawal (HCC)   Diabetes mellitus without complication (HCC)   Hypertension   Depression   Abnormal LFTs   GERD (gastroesophageal reflux disease)   Hematemesis   Lactic acidosis   Mallory-Weiss tear  GI bleed with hematemesis- Mallory weiss tear- patient is without further bleeding since admission.  No more episodes of vomiting. Hgb remains stable 11.3>11.4>12.1. Improved Bps. Continues to complain of esophageal discomfort including odynophagia.  -GI following, appreciate recommendations  - EGD 11/23-showed no varices.  Positive for esophagitis with bleeding as well as a 1 mm nonbleeding Mallory-Weiss tear with stigmata of recent bleeding.  Gastritis also present without active bleeding. -PPI twice daily -Repeat EGD in 3 months  DKA-resolved.  Type II DM - increase long-acting to 30u tomorrow -Continue sliding scale -Continue simvastatin - restarted home glipizide and metformin  Alcohol dependence- CIWAs overall improved -CIWA monitoring with as needed valium, as patient is unable to tolerate  ativan -slow Librium taper -Continue thiamine, folate, multivitamin - TOC provided resources for OP rehab  HTN-well controlled to mildly elevated. 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  Thrombocytopenia- platelets stable at 126>123>122. Related to acute illness/liver disease.  - CBC am  GERD -On PPI  DVT prophylaxis: SCDs Start: 03/11/21 1418  Diet:  Diet Orders (From admission, onward)     Start     Ordered   03/12/21 1448  Diet heart healthy/carb modified Room service appropriate? Yes; Fluid consistency: Thin  Diet effective now       Question Answer Comment  Diet-HS Snack? Nothing   Room service appropriate? Yes   Fluid consistency: Thin      03/12/21 1448            Subjective 03/15/21    Pt reports feeling better. Has residual odynophagia. Also endorses gait instability with weakness.   Disposition Plan & Communication  Patient status: Inpatient  Admitted From: Home Disposition: Home Anticipated discharge date: 11/27  Family Communication: none  Consults, Procedures, Significant Events  Consultants:  GI  Procedures/significant events:  11/23- EGD Antimicrobials:  Anti-infectives (From admission, onward)    Start     Dose/Rate Route Frequency Ordered Stop   03/11/21 1345  cefTRIAXone (ROCEPHIN) 1 g in sodium chloride 0.9 % 100 mL IVPB        1 g 200 mL/hr over 30 Minutes Intravenous  Once 03/11/21 1336 03/11/21 1511       Objective   Vitals:   03/14/21 1148 03/14/21 1951 03/14/21 2355 03/15/21 0453  BP: (!) 142/93 132/90 132/88 (!) 142/94  Pulse: 78 71 78 70  Resp: 13 18 18 18   Temp: 98.4 F (36.9 C) 99.2 F (37.3 C) 98.9 F (37.2 C) 98.8 F (37.1 C)  TempSrc: Oral Oral    SpO2: 95% 96% 94% 95%  Weight:      Height:        Intake/Output Summary (Last 24 hours) at 03/15/2021 0652 Last data filed  at 03/14/2021 2000 Gross per 24 hour  Intake 120 ml  Output 900 ml  Net -780 ml    Filed Weights   03/11/21 1059 03/11/21 1530  Weight: 99.8 kg 99.8 kg    Patient BMI: Body mass index is 28.25 kg/m.   Physical Exam: General: awake, alert, NAD HEENT: atraumatic, clear conjunctiva, anicteric sclera, moist mucus membranes, hearing grossly normal Respiratory: normal respiratory effort. Cardiovascular: quick capillary refill  Gastrointestinal: soft, tenderness epigastric area, sternum Nervous: A&O x3. no gross focal neurologic deficits, normal speech Extremities: moves all equally, no edema, normal tone Skin: dry, intact, normal temperature, normal color, No rashes, lesions or ulcers Psychiatry: normal mood, congruent affect  Labs   I have personally reviewed following labs and imaging studies CBC    Component Value Date/Time   WBC 5.0 03/14/2021 0508   RBC 3.58 (L) 03/14/2021 0508   HGB 12.1 (L) 03/14/2021 0508   HCT 35.9 (L) 03/14/2021 0508   PLT 122 (L) 03/14/2021 0508   MCV 100.3 (H) 03/14/2021 0508   MCH 33.8 03/14/2021 0508   MCHC 33.7 03/14/2021 0508   RDW 12.8 03/14/2021 0508   LYMPHSABS 0.8 03/11/2021 1134   MONOABS 0.8 03/11/2021 1134   EOSABS 0.0 03/11/2021 1134   BASOSABS 0.0 03/11/2021 1134   BMP Latest Ref Rng & Units 03/15/2021 03/14/2021 03/13/2021  Glucose 70 - 99 mg/dL 03/15/2021) 062(I) 948(N)  BUN 6 - 20 mg/dL 7 7 10   Creatinine 0.61 - 1.24 mg/dL 462(V) ) 0.35(K)  Sodium 135 - 145 mmol/L 137 135 133(L)  Potassium 3.5 - 5.1 mmol/L 3.7 3.6 3.5  Chloride 98 - 111 mmol/L 103 103 103  CO2 22 - 32 mmol/L 28 24 24   Calcium 8.9 - 10.3 mg/dL 7.9(L) 7.4(L) 7.1(L)   Imaging Studies  No results found. Medications   Scheduled Meds:  atenolol  25 mg Oral Daily   buPROPion  300 mg Oral Daily   chlordiazePOXIDE  25 mg Oral BID   Followed by   0.93(G ON 03/16/2021] chlordiazePOXIDE  25 mg Oral Daily   Chlorhexidine Gluconate Cloth  6 each Topical Q0600    doxepin  25 mg Oral Daily   folic acid  1 mg Oral Daily   insulin aspart  0-20 Units Subcutaneous TID WC   insulin glargine-yfgn  25 Units Subcutaneous Daily   lidocaine  1 patch Transdermal Q24H   multivitamin with minerals  1 tablet Oral Daily   pantoprazole  40 mg Intravenous Q12H   PARoxetine  40 mg Oral Daily   simvastatin  20 mg Oral QHS   sucralfate  1 g Oral TID WC & HS   thiamine  100 mg Oral Daily   Or   thiamine  100 mg Intravenous Daily   No recently discontinued medications to reconcile  LOS: 4 days   Time spent: >4min  Melene Muller, DO [facility] 03/15/2021, 6:52 AM   Please refer to amion to contact the [facility] Attending or Consulting provider for this pt  [email or web address] Available by Epic secure chat  7AM-7PM. If 7PM-7AM, please contact night-coverage

## 2021-03-15 NOTE — Evaluation (Signed)
Occupational Therapy Evaluation Patient Details Name: Jacob Frederick MRN: [id number] DOB: 04-23-1965 Today's Date: 03/15/2021   History of Present Illness Pt is a 55 y/o M with PMH: T2DM, HTN, Lyme disease, DDD, alcohol abuse and depression who presented with N/V, abdominal pain and hematemesis. Adm for tx of DKA and GIB.   Clinical Impression   Pt seen for OT evaluation this date in setting of acute hospitalization d/t GIB. Pt reports being INDEP at baseline. Pt presents this date with slightly decreased activity tolerance and balance versus his baseline. Currently he is performing seated ADLs with SETUP and standing self care with SBA. He requires CGA for initial CTS but demos improved stability with RW for CTS, only requiring SUPV and from there, completes fxl mobility w/in his room with no gross LOB, only minimal sway. Pt returned to bed end of session with all needs met and in reach. RN updated on session. Will continue to follow acutely. Do not anticipate that pt will require f/u OT services, but do recommend that he uses 2WW for fxl mobility at this time and increase fxl activity participation in the home setting to prevent atrophy.      Recommendations for follow up therapy are one component of a multi-disciplinary discharge planning process, led by the attending physician.  Recommendations may be updated based on patient status, additional functional criteria and insurance authorization.   Follow Up Recommendations  No OT follow up    Assistance Recommended at Discharge PRN  Functional Status Assessment  Patient has had a recent decline in their functional status and demonstrates the ability to make significant improvements in function in a reasonable and predictable amount of time.  Equipment Recommendations  Tub/shower seat;Other (comment) (2ww)    Recommendations for Other Services       Precautions / Restrictions Precautions Precautions: Fall Restrictions Weight Bearing  Restrictions: No      Mobility Bed Mobility Overal bed mobility: Modified Independent                  Transfers Overall transfer level: Needs assistance Equipment used: Rolling walker (2 wheels) Transfers: Sit to/from Stand Sit to Stand: Supervision;Min guard                  Balance Overall balance assessment: Mild deficits observed, not formally tested                                         ADL either performed or assessed with clinical judgement   ADL                                         General ADL Comments: INDEP for UB ADLs in sitting, requires increased time for LB ADLs, but no physical assist in sitting. Pt able to CTS with no AD with CGA, more successful with RW. Requires SUPV only with use of RW for fxl mobility and STS. Cues to sequence use of RW as he is unfamiliar with this DME.     Vision Patient Visual Report: No change from baseline       Perception     Praxis      Pertinent Vitals/Pain Pain Assessment: No/denies pain     Hand Dominance     Extremity/Trunk Assessment Upper Extremity Assessment  Upper Extremity Assessment: Overall WFL for tasks assessed   Lower Extremity Assessment Lower Extremity Assessment: Overall WFL for tasks assessed       Communication Communication Communication: No difficulties   Cognition Arousal/Alertness: Awake/alert Behavior During Therapy: WFL for tasks assessed/performed;Flat affect Overall Cognitive Status: Within Functional Limits for tasks assessed                                       General Comments       Exercises Other Exercises Other Exercises: OT ed with pt re: role in acute setting   Shoulder Instructions      Home Living Family/patient expects to be discharged to:: Private residence Living Arrangements: Alone Available Help at Discharge: Family;Available PRN/intermittently (son lives in [place], but hours away) Type of Home:  House Home Access: Stairs to enter [employer] of Steps: 2 Entrance Stairs-Rails: Right Home Layout: One level               Home Equipment: Cane - single point   Additional Comments: used cane occasionally when he has flare ups of back pain, but rarely.      Prior Functioning/Environment Prior Level of Function : Independent/Modified Independent;Driving             Mobility Comments: no AD for fxl mobility typically except occasional use of RW ADLs Comments: able to perform all self care        OT Problem List: Decreased strength;Decreased activity tolerance;Decreased knowledge of use of DME or AE      OT Treatment/Interventions:      OT Goals(Current goals can be found in the care plan section) Acute Rehab OT Goals Patient Stated Goal: to quit drinking and get in better shape OT Goal Formulation: With patient Time For Goal Achievement: 03/29/21 Potential to Achieve Goals: Good  OT Frequency: Min 1X/week   Barriers to D/C:            Co-evaluation              AM-PAC OT "6 Clicks" Daily Activity     Outcome Measure Help from another person eating meals?: None Help from another person taking care of personal grooming?: None Help from another person toileting, which includes using toliet, bedpan, or urinal?: A Little Help from another person bathing (including washing, rinsing, drying)?: A Little Help from another person to put on and taking off regular upper body clothing?: None Help from another person to put on and taking off regular lower body clothing?: A Little 6 Click Score: 21   End of Session Equipment Utilized During Treatment: Gait belt;Rolling walker (2 wheels) Nurse Communication: Mobility status  Activity Tolerance: Patient tolerated treatment well Patient left: in bed;with call bell/phone within reach  OT Visit Diagnosis: Muscle weakness (generalized) (M62.81)                Time: 5072-2575 OT Time Calculation (min):  35 min Charges:  OT General Charges $OT Visit: 1 Visit OT Evaluation $OT Eval Moderate Complexity: 1 Mod OT Treatments $Self Care/Home Management : 8-22 mins  Gerrianne Scale, [place], OTR/L ascom [phone] 03/15/21, 2:44 PM

## 2021-03-15 NOTE — Progress Notes (Signed)
Inpatient Diabetes Program Recommendations  AACE/ADA: New Consensus Statement on Inpatient Glycemic Control (2015)  Target Ranges:  Prepandial:   less than 140 mg/dL      Peak postprandial:   less than 180 mg/dL (1-2 hours)      Critically ill patients:  140 - 180 mg/dL   Lab Results  Component Value Date   GLUCAP 224 (H) 03/15/2021   HGBA1C 8.8 (H) 03/11/2021    Review of Glycemic Control  Latest Reference Range & Units 03/14/21 16:53 03/14/21 21:05 03/15/21 08:09  Glucose-Capillary 70 - 99 mg/dL 122 (H) 482 (H) 500 (H)  (H): Data is abnormally high   Diabetes history: DM2 Outpatient Diabetes medications: Lantus 30 units daily, Metformin 1000 mg BID, Glipizide 10 mg daily Current orders for Inpatient glycemic control: Semglee 25 units daily, Novolog 0-20 units TID, Glipizide 10 mg QD, Metformin 1000 mg QD     Inpatient Diabetes Program Recommendations:     Consider further increasing Semglee to 30 units QD (patient's home dose).   Thanks, Lujean Rave, MSN, RNC-OB Diabetes Coordinator [phone] (8a-5p)

## 2021-03-15 NOTE — Evaluation (Signed)
Physical Therapy Evaluation Patient Details Name: Jacob Frederick MRN: [id number] DOB: 08-11-1965 Today's Date: 03/15/2021  History of Present Illness  Pt is a 55 y/o M with PMH: T2DM, HTN, Lyme disease, DDD, alcohol abuse and depression who presented with N/V, abdominal pain and hematemesis. Adm for tx of DKA and GIB.  Clinical Impression  Pt sleeping soundly on arrival but did wake with tactile cuing (not with repeated loud calling of name).  Pt was able to do bed mobility and transfers w/o too much issue but did show general unsteadiness and was unable to safely ambulation w/o AD.  Pt reports that he is out of the home daily (typically to [facility] store BID) and is feeling much weaker than his normal, would benefit from further PT but reports he does not want anyone in the home at this time.       Recommendations for follow up therapy are one component of a multi-disciplinary discharge planning process, led by the attending physician.  Recommendations may be updated based on patient status, additional functional criteria and insurance authorization.  Follow Up Recommendations  (reports his home is too cluttered and he would be embarassed for HHPT, does not wish to have PT at d/c)    Assistance Recommended at Discharge Intermittent Supervision/Assistance  Functional Status Assessment Patient has had a recent decline in their functional status and demonstrates the ability to make significant improvements in function in a reasonable and predictable amount of time.  Equipment Recommendations  Rolling walker (2 wheels)    Recommendations for Other Services       Precautions / Restrictions Precautions Precautions: Fall Restrictions Weight Bearing Restrictions: No      Mobility  Bed Mobility Overal bed mobility: Modified Independent             General bed mobility comments: easily gets to sitting EOB    Transfers Overall transfer level: Needs assistance Equipment used: Rolling  walker (2 wheels) Transfers: Sit to/from Stand Sit to Stand: Supervision           General transfer comment: Pt with some need for UEs but did not have issues getting to standing apart from general feelings of unsteadiness    Ambulation/Gait Ambulation/Gait assistance: Min guard Gait Distance (Feet): 125 Feet Assistive device: Rolling walker (2 wheels)         General Gait Details: Pt with some unsteadiness when trying to ambulate without AD, showed reliance on the walker but was able to maintain a consistent cadence with no overt LOBs  Stairs            Wheelchair Mobility    Modified Rankin (Stroke Patients Only)       Balance Overall balance assessment: Needs assistance Sitting-balance support: No upper extremity supported Sitting balance-Leahy Scale: Normal     Standing balance support: Bilateral upper extremity supported Standing balance-Leahy Scale: Fair Standing balance comment: Pt with no LOBs but general unsteadiness and definite need of AD during ambulation                             Pertinent Vitals/Pain Pain Assessment:  (chronic back pain/soreness)    Home Living Family/patient expects to be discharged to:: Private residence Living Arrangements: Alone Available Help at Discharge: Family;Available PRN/intermittently (son lives hours away, mother is nearly 108 but could help as able) Type of Home: House Home Access: Stairs to enter Entrance Stairs-Rails: Right Entrance Stairs-Number of Steps: 2  Home Layout: One level Home Equipment: Cane - single point Additional Comments: used cane occasionally when he has flare ups of back pain, but rarely.    Prior Function Prior Level of Function : Independent/Modified Independent;Driving             Mobility Comments: no AD for fxl mobility typically except occasional use of RW ADLs Comments: able to perform all self care     Hand Dominance        Extremity/Trunk Assessment    Upper Extremity Assessment Upper Extremity Assessment: Overall WFL for tasks assessed    Lower Extremity Assessment Lower Extremity Assessment: Overall WFL for tasks assessed       Communication   Communication: No difficulties  Cognition Arousal/Alertness: Awake/alert Behavior During Therapy: WFL for tasks assessed/performed;Flat affect Overall Cognitive Status: Within Functional Limits for tasks assessed                                          General Comments General comments (skin integrity, edema, etc.): Pt reports feeling general withdrawl symptoms    Exercises Other Exercises Other Exercises: OT ed with pt re: role in acute setting   Assessment/Plan    PT Assessment Patient needs continued PT services  PT Problem List Decreased strength;Decreased activity tolerance;Decreased balance;Decreased mobility;Decreased knowledge of use of DME;Decreased safety awareness       PT Treatment Interventions Gait training;DME instruction;Stair training;Functional mobility training;Therapeutic activities;Therapeutic exercise;Balance training;Neuromuscular re-education;Patient/family education    PT Goals (Current goals can be found in the Care Plan section)  Acute Rehab PT Goals Patient Stated Goal: go home, stop drinking, get into rehab next year PT Goal Formulation: With patient/family Time For Goal Achievement: 03/29/21 Potential to Achieve Goals: Fair    Frequency Min 2X/week   Barriers to discharge        Co-evaluation               AM-PAC PT "6 Clicks" Mobility  Outcome Measure Help needed turning from your back to your side while in a flat bed without using bedrails?: None Help needed moving from lying on your back to sitting on the side of a flat bed without using bedrails?: None Help needed moving to and from a bed to a chair (including a wheelchair)?: None Help needed standing up from a chair using your arms (e.g., wheelchair or bedside  chair)?: None Help needed to walk in hospital room?: A Little Help needed climbing 3-5 steps with a railing? : A Little 6 Click Score: 22    End of Session Equipment Utilized During Treatment: Gait belt Activity Tolerance: Patient tolerated treatment well;Patient limited by fatigue     PT Visit Diagnosis: Muscle weakness (generalized) (M62.81);Difficulty in walking, not elsewhere classified (R26.2)    Time: 6237-6283 PT Time Calculation (min) (ACUTE ONLY): 33 min   Charges:   PT Evaluation $PT Eval Low Complexity: 1 Low PT Treatments $Gait Training: 8-22 mins        Malachi Pro, DPT 03/15/2021, 5:26 PM

## 2021-03-16 DIAGNOSIS — F320 Major depressive disorder, single episode, mild: Secondary | ICD-10-CM

## 2021-03-16 DIAGNOSIS — R7989 Other specified abnormal findings of blood chemistry: Secondary | ICD-10-CM | POA: Diagnosis not present

## 2021-03-16 DIAGNOSIS — F10939 Alcohol use, unspecified with withdrawal, unspecified: Secondary | ICD-10-CM | POA: Diagnosis not present

## 2021-03-16 DIAGNOSIS — F1010 Alcohol abuse, uncomplicated: Secondary | ICD-10-CM | POA: Diagnosis not present

## 2021-03-16 DIAGNOSIS — E1110 Type 2 diabetes mellitus with ketoacidosis without coma: Secondary | ICD-10-CM | POA: Diagnosis not present

## 2021-03-16 LAB — COMPREHENSIVE METABOLIC PANEL
ALT: 27 U/L (ref 0–44)
AST: 54 U/L — ABNORMAL HIGH (ref 15–41)
Albumin: 2.8 g/dL — ABNORMAL LOW (ref 3.5–5.0)
Alkaline Phosphatase: 79 U/L (ref 38–126)
Anion gap: 9 (ref 5–15)
BUN: 9 mg/dL (ref 6–20)
CO2: 23 mmol/L (ref 22–32)
Calcium: 8.4 mg/dL — ABNORMAL LOW (ref 8.9–10.3)
Chloride: 106 mmol/L (ref 98–111)
Creatinine, Ser: 0.76 mg/dL (ref 0.61–1.24)
GFR, Estimated: >60 mL/min (ref >60)
Glucose, Bld: 226 mg/dL — ABNORMAL HIGH (ref 70–99)
Potassium: 4.5 mmol/L (ref 3.5–5.1)
Sodium: 138 mmol/L (ref 135–145)
Total Bilirubin: 0.9 mg/dL (ref 0.3–1.2)
Total Protein: 5.6 g/dL — ABNORMAL LOW (ref 6.5–8.1)

## 2021-03-16 LAB — GLUCOSE, CAPILLARY
Glucose-Capillary: 208 mg/dL — ABNORMAL HIGH (ref 70–99)
Glucose-Capillary: 211 mg/dL — ABNORMAL HIGH (ref 70–99)

## 2021-03-16 MED ORDER — ACETAMINOPHEN 325 MG PO TABS: 650.0000 mg | ORAL_TABLET | Freq: Two times a day (BID) | ORAL | Status: AC | PRN

## 2021-03-16 MED ORDER — AMLODIPINE BESYLATE 10 MG PO TABS
10.0000 mg | ORAL_TABLET | Freq: Every day | ORAL | Status: DC
  Administered 2021-03-16: 13:00:00 10 mg via ORAL
  Filled 2021-03-16: qty 1

## 2021-03-16 MED ORDER — CHLORDIAZEPOXIDE HCL 25 MG PO CAPS
25.0000 mg | ORAL_CAPSULE | Freq: Every day | ORAL | 0 refills | Status: AC
Start: 2021-03-17 — End: 2021-03-21

## 2021-03-16 MED ORDER — FOLIC ACID 1 MG PO TABS: 1.0000 mg | ORAL_TABLET | Freq: Every day | ORAL | 0 refills | Status: AC

## 2021-03-16 MED ORDER — ADULT MULTIVITAMIN W/MINERALS CH: 1.0000 | ORAL_TABLET | Freq: Every day | ORAL | Status: AC

## 2021-03-16 MED ORDER — INSULIN GLARGINE 100 UNIT/ML SOLOSTAR PEN: 30.0000 [IU] | PEN_INJECTOR | Freq: Every day | SUBCUTANEOUS | 11 refills | Status: AC

## 2021-03-16 MED ORDER — CHLORDIAZEPOXIDE HCL 25 MG PO CAPS: 25.0000 mg | ORAL_CAPSULE | Freq: Every day | ORAL | Status: DC

## 2021-03-16 MED ORDER — SUCRALFATE 1 GM/10ML PO SUSP: 1.0000 g | Freq: Three times a day (TID) | ORAL | 0 refills | Status: AC

## 2021-03-16 NOTE — Progress Notes (Signed)
Nsg Discharge Note  Admit Date:  03/11/2021 Discharge date: 03/16/2021   Carol Ada to be D/C'd Home per MD order.  AVS completed.  Patient/caregiver able to verbalize understanding.  Discharge Medication: Allergies as of 03/16/2021       Reactions   Contrast Media [iodinated Diagnostic Agents] Swelling   Ativan [lorazepam] Other (See Comments)   Hallucinations   Iohexol Swelling   Facial swelling, arm and leg swelling after CT scan with contrast   Lisinopril Swelling   Angioedema        Medication List     STOP taking these medications    aspirin 81 MG EC tablet   ergocalciferol 1.25 MG (50000 UT) capsule Commonly known as: VITAMIN D2   naltrexone 50 MG tablet Commonly known as: DEPADE       TAKE these medications    acetaminophen 325 MG tablet Commonly known as: TYLENOL Take 2 tablets (650 mg total) by mouth every 12 (twelve) hours as needed for mild pain or headache.   amLODipine 10 MG tablet Commonly known as: NORVASC Take 10 mg by mouth daily.   atenolol 50 MG tablet Commonly known as: TENORMIN Take 1 tablet by mouth daily.   buPROPion 300 MG 24 hr tablet Commonly known as: WELLBUTRIN XL Take 300 mg by mouth daily.   chlordiazePOXIDE 25 MG capsule Commonly known as: LIBRIUM Take 1 capsule (25 mg total) by mouth daily for 4 days. Start taking on: March 17, 2021   doxepin 25 MG capsule Commonly known as: SINEQUAN Take 25 mg by mouth daily.   folic acid 1 MG tablet Commonly known as: FOLVITE Take 1 tablet (1 mg total) by mouth daily. Start taking on: March 17, 2021   gabapentin 300 MG capsule Commonly known as: NEURONTIN Take 1 capsule by mouth every 8 (eight) hours as needed.   glipiZIDE 10 MG tablet Commonly known as: GLUCOTROL Take 10 mg by mouth daily.   insulin glargine 100 UNIT/ML Solostar Pen Commonly known as: LANTUS Inject 30 Units into the skin daily. What changed: how much to take   lidocaine 5 % Commonly  known as: LIDODERM Place onto the skin.   metFORMIN 1000 MG tablet Commonly known as: GLUCOPHAGE Take 1 tablet by mouth in the morning and at bedtime.   multivitamin with minerals Tabs tablet Take 1 tablet by mouth daily. Start taking on: March 17, 2021   omeprazole 40 MG capsule Commonly known as: PRILOSEC Take 40 mg by mouth daily. What changed: Another medication with the same name was removed. Continue taking this medication, and follow the directions you see here.   PARoxetine 40 MG tablet Commonly known as: PAXIL Take 40 mg by mouth daily.   simvastatin 20 MG tablet Commonly known as: ZOCOR Take 20 mg by mouth daily.   sucralfate 1 GM/10ML suspension Commonly known as: CARAFATE Take 10 mLs (1 g total) by mouth 4 (four) times daily -  with meals and at bedtime.   testosterone cypionate 200 MG/ML injection Commonly known as: DEPOTESTOSTERONE CYPIONATE 0.31mL weekly IM               Durable Medical Equipment  (From admission, onward)           Start     Ordered   03/16/21 1200  For home use only DME Walker rolling  Once       Question Answer Comment  Walker: With 5 Inch Wheels   Patient needs a walker to treat with  the following condition Physical deconditioning      03/16/21 1159            Discharge Assessment: Vitals:   03/16/21 0756 03/16/21 1232  BP: (!) 144/106 134/90  Pulse: 61 (!) 59  Resp: 15 14  Temp: 98 F (36.7 C) 98 F (36.7 C)  SpO2: 95% 95%   IV catheter discontinued intact. Site without signs and symptoms of complications - no redness or edema noted at insertion site, patient denies c/o pain - only slight tenderness at site.  Dressing with slight pressure applied.  D/c Instructions-Education: Discharge instructions given to patient/family with verbalized understanding. D/c education completed with patient/family including follow up instructions, medication list, d/c activities limitations if indicated, with other d/c  instructions as indicated by MD - patient able to verbalize understanding, all questions fully answered. Patient instructed to return to ED, call 911, or call MD for any changes in condition.  Patient escorted via WC, and D/C home via private auto.  Quentez Lober, Tilford Pillar, RN 03/16/2021 4:24 PM

## 2021-03-16 NOTE — Discharge Instructions (Signed)
You had bleeding from your esophagus and stomach which was irritated by alcohol.  Your bleeding stabilized but you must not drink alcohol any more or it will worsen the condition.  Please follow up with the resources given to you by social worker in hospital.

## 2021-03-16 NOTE — Discharge Summary (Signed)
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 He remained hemodynamically stable and underwent EGD. He was found to have extensive gastritis and mallory weiss tear which had spontaneous hemostasis. He remained stable throughout admission but discharge was delayed for alcohol withdrawal. He received PRN valium with librium taper. He did not experience seizures. He was provided with resources for alcohol dependence. He declined recommended home health PT.   Discharge Diagnoses:  Principal Problem:   DKA (diabetic ketoacidosis) (HCC) Active Problems:   Alcohol abuse   Alcohol withdrawal (HCC)   Diabetes mellitus without complication (HCC)   Hypertension   Depression   Abnormal LFTs   GERD (gastroesophageal reflux disease)   Hematemesis   Lactic acidosis   Mallory-Weiss tear    Allergies as of 03/16/2021       Reactions   Contrast Media [iodinated Diagnostic Agents] Swelling   Ativan [lorazepam] Other (See Comments)   Hallucinations   Iohexol Swelling   Facial swelling, arm and leg swelling after CT scan with contrast   Lisinopril Swelling   Angioedema        Medication List     STOP taking these medications    aspirin 81 MG EC tablet   ergocalciferol 1.25 MG (50000 UT)  capsule Commonly known as: VITAMIN D2   naltrexone 50 MG tablet Commonly known as: DEPADE       TAKE these medications    acetaminophen 325 MG tablet Commonly known as: TYLENOL Take 2 tablets (650 mg total) by mouth every 12 (twelve) hours as needed for mild pain or headache.   amLODipine 10 MG tablet Commonly known as: NORVASC Take 10 mg by mouth daily.   atenolol 50 MG tablet Commonly known as: TENORMIN Take 1 tablet by mouth daily.   buPROPion 300 MG 24 hr tablet Commonly known as: WELLBUTRIN XL Take 300 mg by mouth daily.   chlordiazePOXIDE 25 MG capsule Commonly known as: LIBRIUM Take 1 capsule (25 mg total) by mouth daily for 4 days.   doxepin 25 MG capsule Commonly known as: SINEQUAN Take 25 mg by mouth daily.   folic acid 1 MG tablet Commonly known as: FOLVITE Take 1 tablet (1 mg total) by mouth daily.   gabapentin 300 MG capsule Commonly known as: NEURONTIN Take 1 capsule by mouth every 8 (eight) hours as needed.   glipiZIDE 10 MG tablet Commonly known as: GLUCOTROL Take 10 mg by mouth daily.   insulin glargine 100 UNIT/ML Solostar Pen Commonly known as: LANTUS Inject 30 Units into the skin daily. What changed: how much to take   lidocaine 5 % Commonly known as: LIDODERM Place onto the skin.   metFORMIN 1000 MG tablet Commonly known as: GLUCOPHAGE Take 1 tablet by mouth in the morning and at bedtime.   multivitamin with minerals Tabs tablet Take 1 tablet by mouth daily.   omeprazole 40 MG capsule Commonly known as: PRILOSEC Take 40  mg by mouth daily. What changed: Another medication with the same name was removed. Continue taking this medication, and follow the directions you see here.   PARoxetine 40 MG tablet Commonly known as: PAXIL Take 40 mg by mouth daily.   simvastatin 20 MG tablet Commonly known as: ZOCOR Take 20 mg by mouth daily.   sucralfate 1 GM/10ML suspension Commonly known as: CARAFATE Take 10 mLs (1 g total) by  mouth 4 (four) times daily -  with meals and at bedtime.   testosterone cypionate 200 MG/ML injection Commonly known as: DEPOTESTOSTERONE CYPIONATE 0.34mL weekly IM        Allergies  Allergen Reactions   Contrast Media [Iodinated Diagnostic Agents] Swelling   Ativan [Lorazepam] Other (See Comments)    Hallucinations   Iohexol Swelling    Facial swelling, arm and leg swelling after CT scan with contrast   Lisinopril Swelling    Angioedema    Consultations: none  Procedures/Studies: CT ABDOMEN PELVIS WO CONTRAST  Result Date: 03/11/2021 CLINICAL DATA:  Alcoholic with N/V/D. eval ascites, sbo EXAM: CT ABDOMEN AND PELVIS WITHOUT CONTRAST TECHNIQUE: Multidetector CT imaging of the abdomen and pelvis was performed following the standard protocol without IV contrast. COMPARISON:  06/09/2016 FINDINGS: Lower chest: No acute abnormality. Hepatobiliary: Hepatic steatosis. Unremarkable gallbladder. No biliary dilatation. Pancreas: Unremarkable. Spleen: Unremarkable. Adrenals/Urinary Tract: Adrenals are unremarkable. Punctate bilateral nonobstructing renal calculi. Bladder is unremarkable. Stomach/Bowel: Stomach is within normal limits. Bowel is normal in caliber. Colonic diverticulosis. Vascular/Lymphatic: Mild aortic atherosclerosis. No enlarged lymph nodes. Reproductive: Unremarkable. Other: No ascites.  Abdominal wall is unremarkable. Musculoskeletal: Lumbar spine degenerative changes. No acute osseous abnormality. IMPRESSION: No acute abnormality. Hepatic steatosis. Punctate nonobstructing renal calculi. Colonic diverticulosis. Electronically Signed   By: Guadlupe Spanish M.D.   On: 03/11/2021 13:27   DG Chest Portable 1 View  Result Date: 03/11/2021 CLINICAL DATA:  Hemoptysis EXAM: PORTABLE CHEST 1 VIEW COMPARISON:  Chest x-ray 10/11/2016 FINDINGS: Heart size and mediastinal contours are within normal limits. No suspicious pulmonary opacities identified. No pleural effusion or pneumothorax  visualized. No acute osseous abnormality appreciated. IMPRESSION: No acute intrathoracic process identified. Electronically Signed   By: Jannifer Hick M.D.   On: 03/11/2021 12:01    Subjective: Patient has not had any more incidents of N/V. Able to tolerate normal diet without odynophagia. Endorses wanting to proceed with outpatient resources for alcohol cessation.   Discharge Exam: Vitals:   03/16/21 0756 03/16/21 1232  BP: (!) 144/106 134/90  Pulse: 61 (!) 59  Resp: 15 14  Temp: 98 F (36.7 C) 98 F (36.7 C)  SpO2: 95% 95%    General: Pt is alert, awake, not in acute distress Cardiovascular: RRR, S1/S2 +, no rubs, no gallops Respiratory: CTA bilaterally, no wheezing, no rhonchi Abdominal: Soft, NT, ND, bowel sounds + Extremities: no edema, no cyanosis  Labs: Basic Metabolic Panel: Recent Labs  Lab 03/12/21 0739 03/13/21 0309 03/14/21 0508 03/15/21 0359 03/16/21 0345  NA 134* 133* 135 137 138  K 3.2* 3.5 3.6 3.7 4.5  CL 99 103 103 103 106  CO2 28 24 24 28 23   GLUCOSE 83 188* 232* 202* 226*  BUN 11 10 7 7 9   CREATININE 0.61 0.57* 0.47* 0.54* 0.76  CALCIUM 7.3* 7.1* 7.4* 7.9* 8.4*   CBC: Recent Labs  Lab 03/11/21 2145 03/12/21 0153 03/12/21 0739 03/13/21 0309 03/14/21 0508  WBC 6.1 4.8 4.7 4.2 5.0  HGB 11.3* 11.2* 11.3* 11.4* 12.1*  HCT 32.1* 32.0* 32.5* 33.7*  35.9*  MCV 97.0 98.5 98.8 101.8* 100.3*  PLT 127* 132* 126* 123* 122*    Microbiology Recent Results (from the past 240 hour(s))  Resp Panel by RT-PCR (Flu A&B, Covid) Urine, Clean Catch     Status: None   Collection Time: 03/11/21 11:34 AM   Specimen: Urine, Clean Catch; Nasopharyngeal(NP) swabs in vial transport medium  Result Value Ref Range Status   SARS Coronavirus 2 by RT PCR NEGATIVE NEGATIVE Final    Comment: (NOTE) SARS-CoV-2 target nucleic acids are NOT DETECTED.  The SARS-CoV-2 RNA is generally detectable in upper respiratory specimens during the acute phase of infection. The  lowest concentration of SARS-CoV-2 viral copies this assay can detect is 138 copies/mL. A negative result does not preclude SARS-Cov-2 infection and should not be used as the sole basis for treatment or other patient management decisions. A negative result may occur with  improper specimen collection/handling, submission of specimen other than nasopharyngeal swab, presence of viral mutation(s) within the areas targeted by this assay, and inadequate number of viral copies(<138 copies/mL). A negative result must be combined with clinical observations, patient history, and epidemiological information. The expected result is Negative.  Fact Sheet for Patients:  BloggerCourse.com  Fact Sheet for Healthcare Providers:  SeriousBroker.it  This test is no t yet approved or cleared by the [place] FDA and  has been authorized for detection and/or diagnosis of SARS-CoV-2 by FDA under an Emergency Use Authorization (EUA). This EUA will remain  in effect (meaning this test can be used) for the duration of the COVID-19 declaration under Section 564(b)(1) of the Act, 21 U.S.C.section 360bbb-3(b)(1), unless the authorization is terminated  or revoked sooner.       Influenza A by PCR NEGATIVE NEGATIVE Final   Influenza B by PCR NEGATIVE NEGATIVE Final    Comment: (NOTE) The Xpert Xpress SARS-CoV-2/FLU/RSV plus assay is intended as an aid in the diagnosis of influenza from Nasopharyngeal swab specimens and should not be used as a sole basis for treatment. Nasal washings and aspirates are unacceptable for Xpert Xpress SARS-CoV-2/FLU/RSV testing.  Fact Sheet for Patients: BloggerCourse.com  Fact Sheet for Healthcare Providers: SeriousBroker.it  This test is not yet approved or cleared by the [place] FDA and has been authorized for detection and/or diagnosis of SARS-CoV-2 by FDA under  an Emergency Use Authorization (EUA). This EUA will remain in effect (meaning this test can be used) for the duration of the COVID-19 declaration under Section 564(b)(1) of the Act, 21 U.S.C. section 360bbb-3(b)(1), unless the authorization is terminated or revoked.  Performed at [facility], [address] [place], [place] [address]   MRSA Next Gen by PCR, Nasal     Status: None   Collection Time: 03/11/21  3:38 PM   Specimen: Nasal Mucosa; Nasal Swab  Result Value Ref Range Status   MRSA by PCR Next Gen NOT DETECTED NOT DETECTED Final    Comment: (NOTE) The GeneXpert MRSA Assay (FDA approved for NASAL specimens only), is one component of a comprehensive MRSA colonization surveillance program. It is not intended to diagnose MRSA infection nor to guide or monitor treatment for MRSA infections. Test performance is not FDA approved in patients less than 10 years old. Performed at [facility], [address], [place], [place] [address]     Time coordinating discharge: Over 30 minutes  Leeroy Bock, MD  [facility] 03/18/2021, 6:45 PM Pager   If 7PM-7AM, please contact night-coverage [email or web address] Password [email or web address]

## 2021-03-16 NOTE — Plan of Care (Signed)
  Problem: Health Behavior/Discharge Planning: Goal: Ability to manage health-related needs will improve Outcome: Adequate for Discharge   

## 2021-03-20 NOTE — Anesthesia Postprocedure Evaluation (Signed)
Anesthesia Post Note  Patient: Jacob Frederick  Procedure(s) Performed: ESOPHAGOGASTRODUODENOSCOPY (EGD)  Patient location during evaluation: PACU Anesthesia Type: General Level of consciousness: awake and awake and alert Pain management: satisfactory to patient Vital Signs Assessment: post-procedure vital signs reviewed and stable Respiratory status: spontaneous breathing and respiratory function stable Cardiovascular status: blood pressure returned to baseline Anesthetic complications: no   No notable events documented.   Last Vitals:  Vitals:   03/16/21 0756 03/16/21 1232  BP: (!) 144/106 134/90  Pulse: 61 (!) 59  Resp: 15 14  Temp: 36.7 C 36.7 C  SpO2: 95% 95%    Last Pain:  Vitals:   03/16/21 1232  TempSrc: Oral  PainSc:                  Jacob Frederick,Jacob Frederick

## 2021-05-21 DEATH — deceased

## 2023-02-18 IMAGING — CT CT ABD-PELV W/O CM
2 of 4 series · 16 of 46 positions shown, 18 images · non-contrast
Comparison: 06/09/2016

CLINICAL DATA: Alcoholic with N/V/D. eval ascites, sbo

EXAM:
CT ABDOMEN AND PELVIS WITHOUT CONTRAST
TECHNIQUE: Multidetector CT imaging of the abdomen and pelvis was performed
following the standard protocol without IV contrast.

[Series 2: routine abd/pel wo · axial · 0.81mm/px · z∈[-792,-242]mm · 13 of 122 slices shown, 15 images]
[im 6/122  soft-tissue]
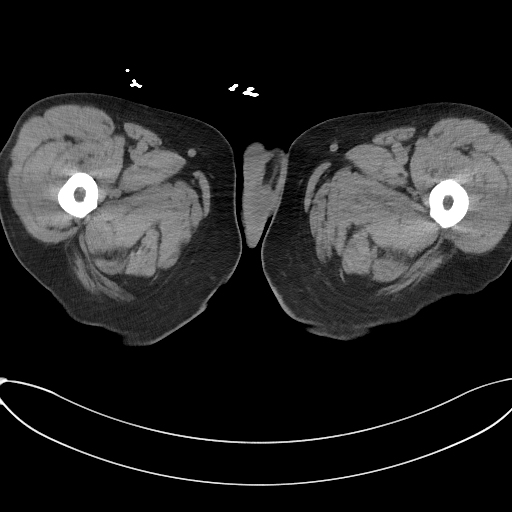
[im 6/122  bone]
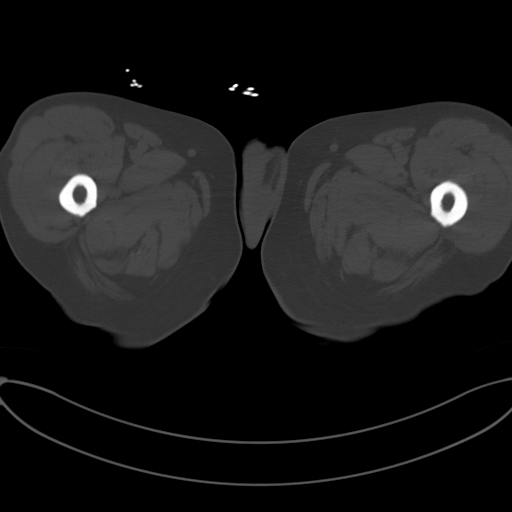
[im 16/122  soft-tissue]
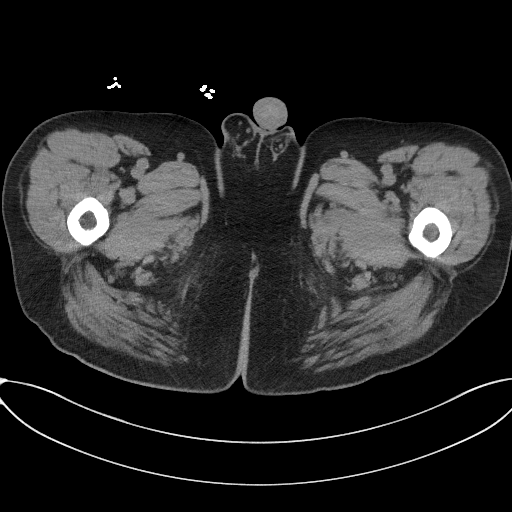
[im 27/122  soft-tissue]
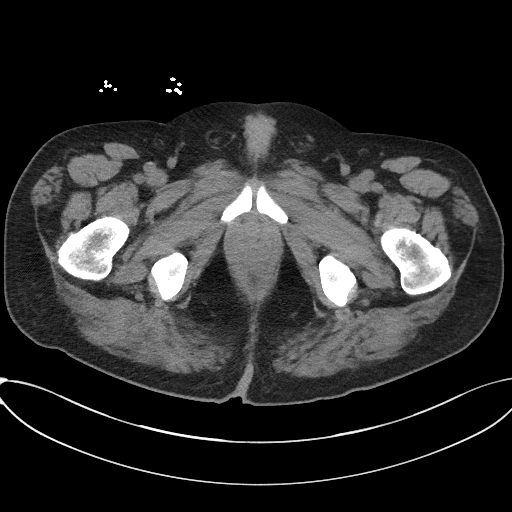
[im 32/122  soft-tissue]
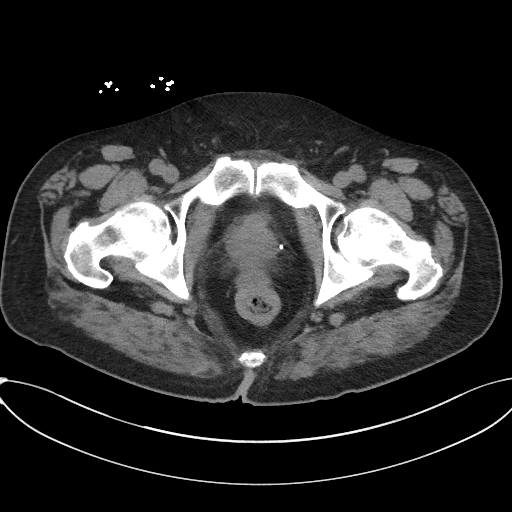
[im 43/122  soft-tissue]
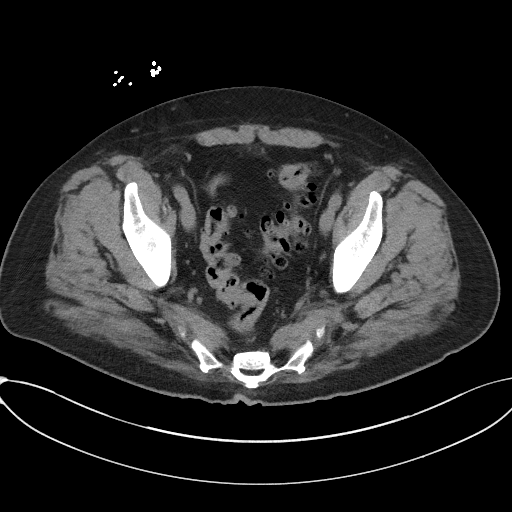
[im 53/122  soft-tissue]
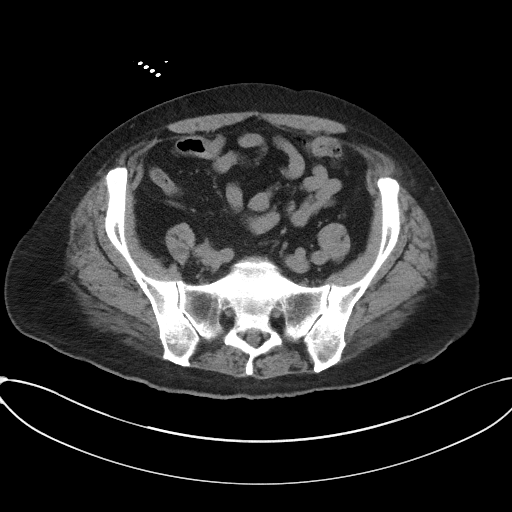
[im 64/122  soft-tissue]
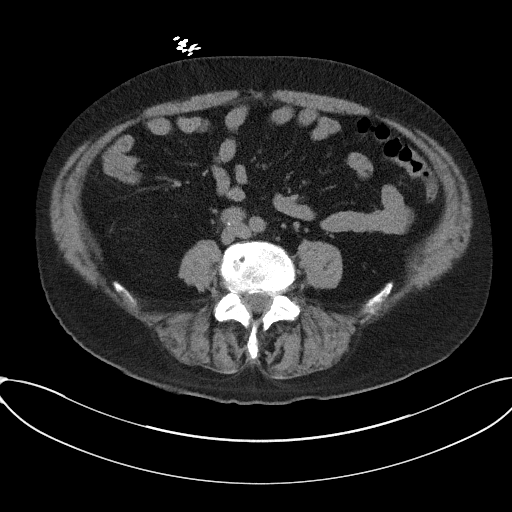
[im 69/122  soft-tissue]
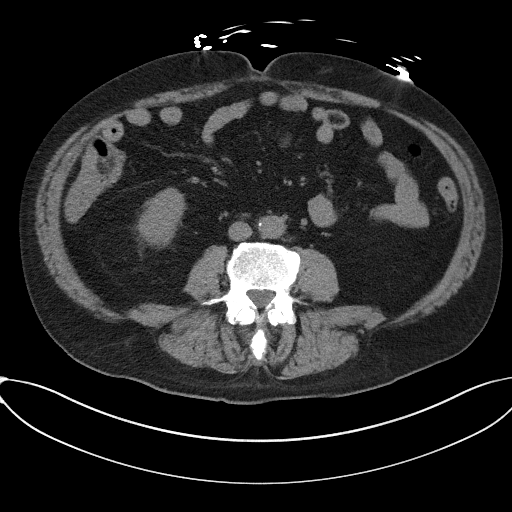
[im 79/122  soft-tissue]
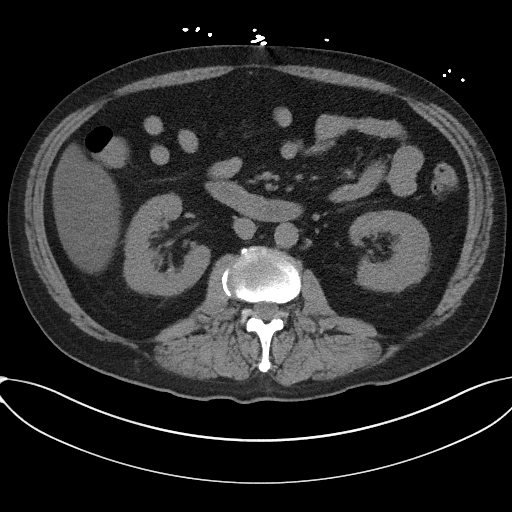
[im 79/122  bone]
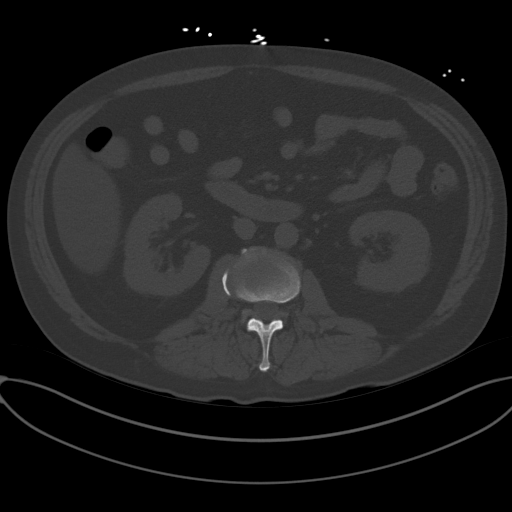
[im 90/122  soft-tissue]
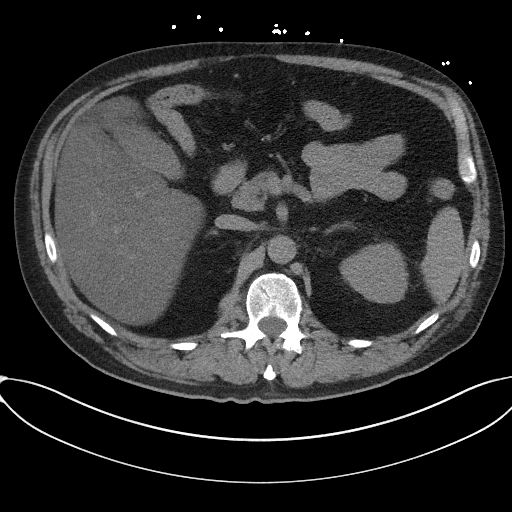
[im 95/122  soft-tissue]
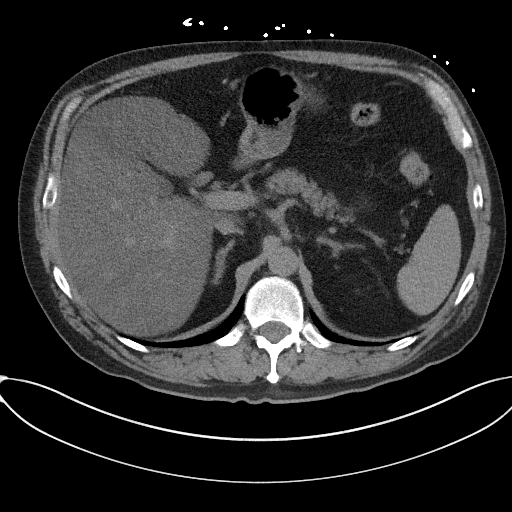
[im 106/122  soft-tissue]
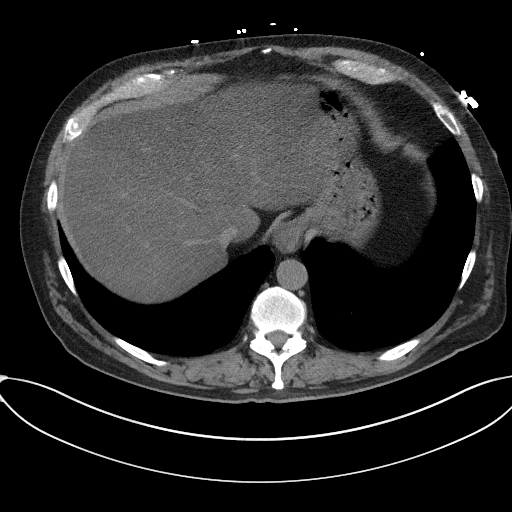
[im 116/122  soft-tissue]
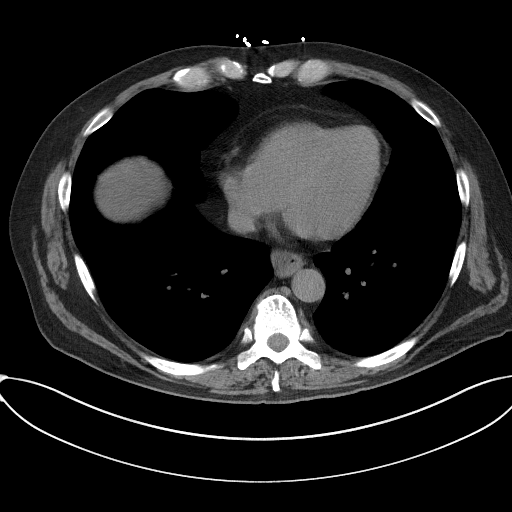

[Series 5: coronal st · coronal · 0.90mm/px · 3 of 103 slices shown]
[im 35/103  soft-tissue]
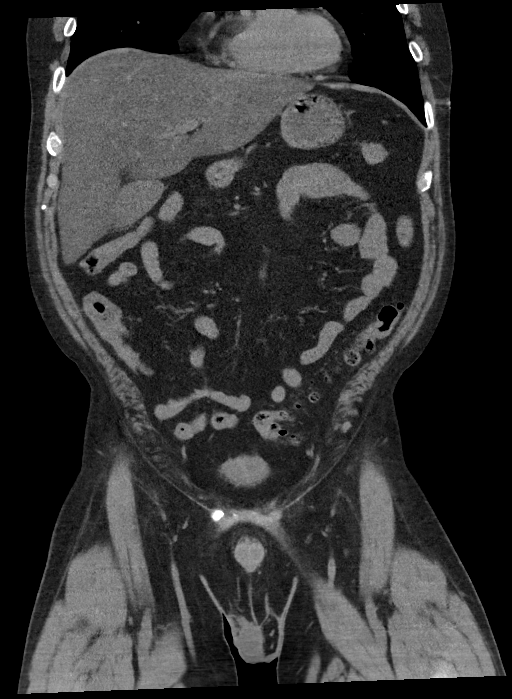
[im 46/103  soft-tissue]
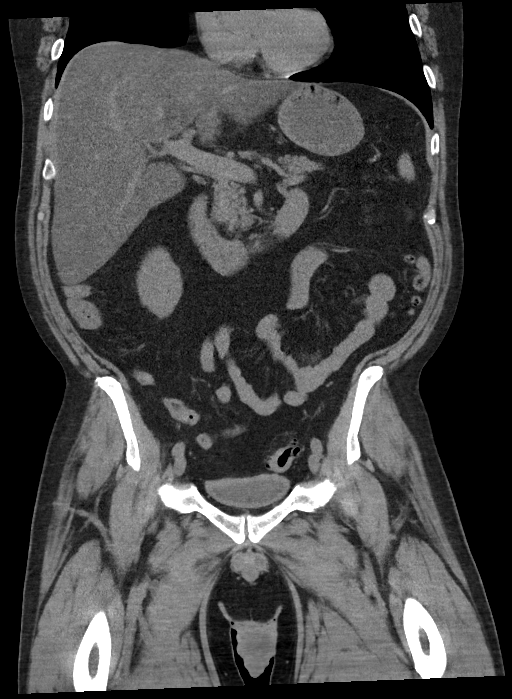
[im 57/103  soft-tissue]
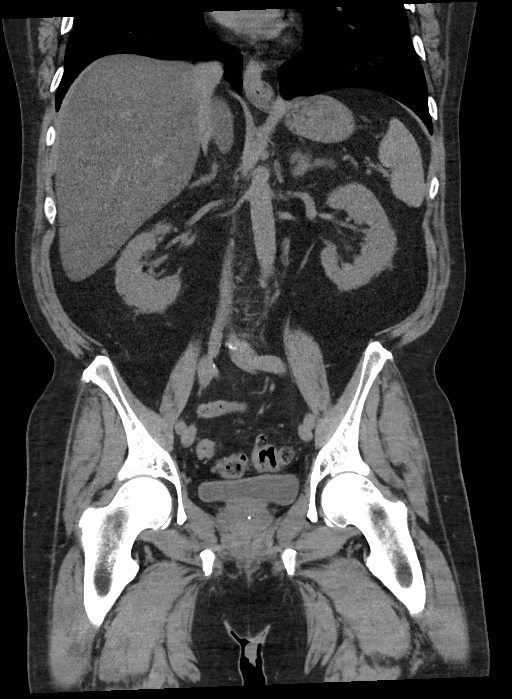

[16 of 46 positions shown; findings below may reference images not displayed]

FINDINGS: Lower chest: No acute abnormality.

Hepatobiliary: Hepatic steatosis. Unremarkable gallbladder. No
biliary dilatation.

Pancreas: Unremarkable.

Spleen: Unremarkable.

Adrenals/Urinary Tract: Adrenals are unremarkable. Punctate
bilateral nonobstructing renal calculi. Bladder is unremarkable.

Stomach/Bowel: Stomach is within normal limits. Bowel is normal in
caliber. Colonic diverticulosis.

Vascular/Lymphatic: Mild aortic atherosclerosis. No enlarged lymph
nodes.

Reproductive: Unremarkable.

Other: No ascites.  Abdominal wall is unremarkable.

Musculoskeletal: Lumbar spine degenerative changes. No acute osseous
abnormality.
IMPRESSION: No acute abnormality.

Hepatic steatosis.

Punctate nonobstructing renal calculi.

Colonic diverticulosis.

## 2023-02-18 IMAGING — DX DG CHEST 1V PORT
2 series · 2 of 2 positions shown · non-contrast
Comparison: Chest x-ray 10/11/2016

CLINICAL DATA: Hemoptysis

EXAM:
PORTABLE CHEST 1 VIEW

[chest ap (1 of 2)]
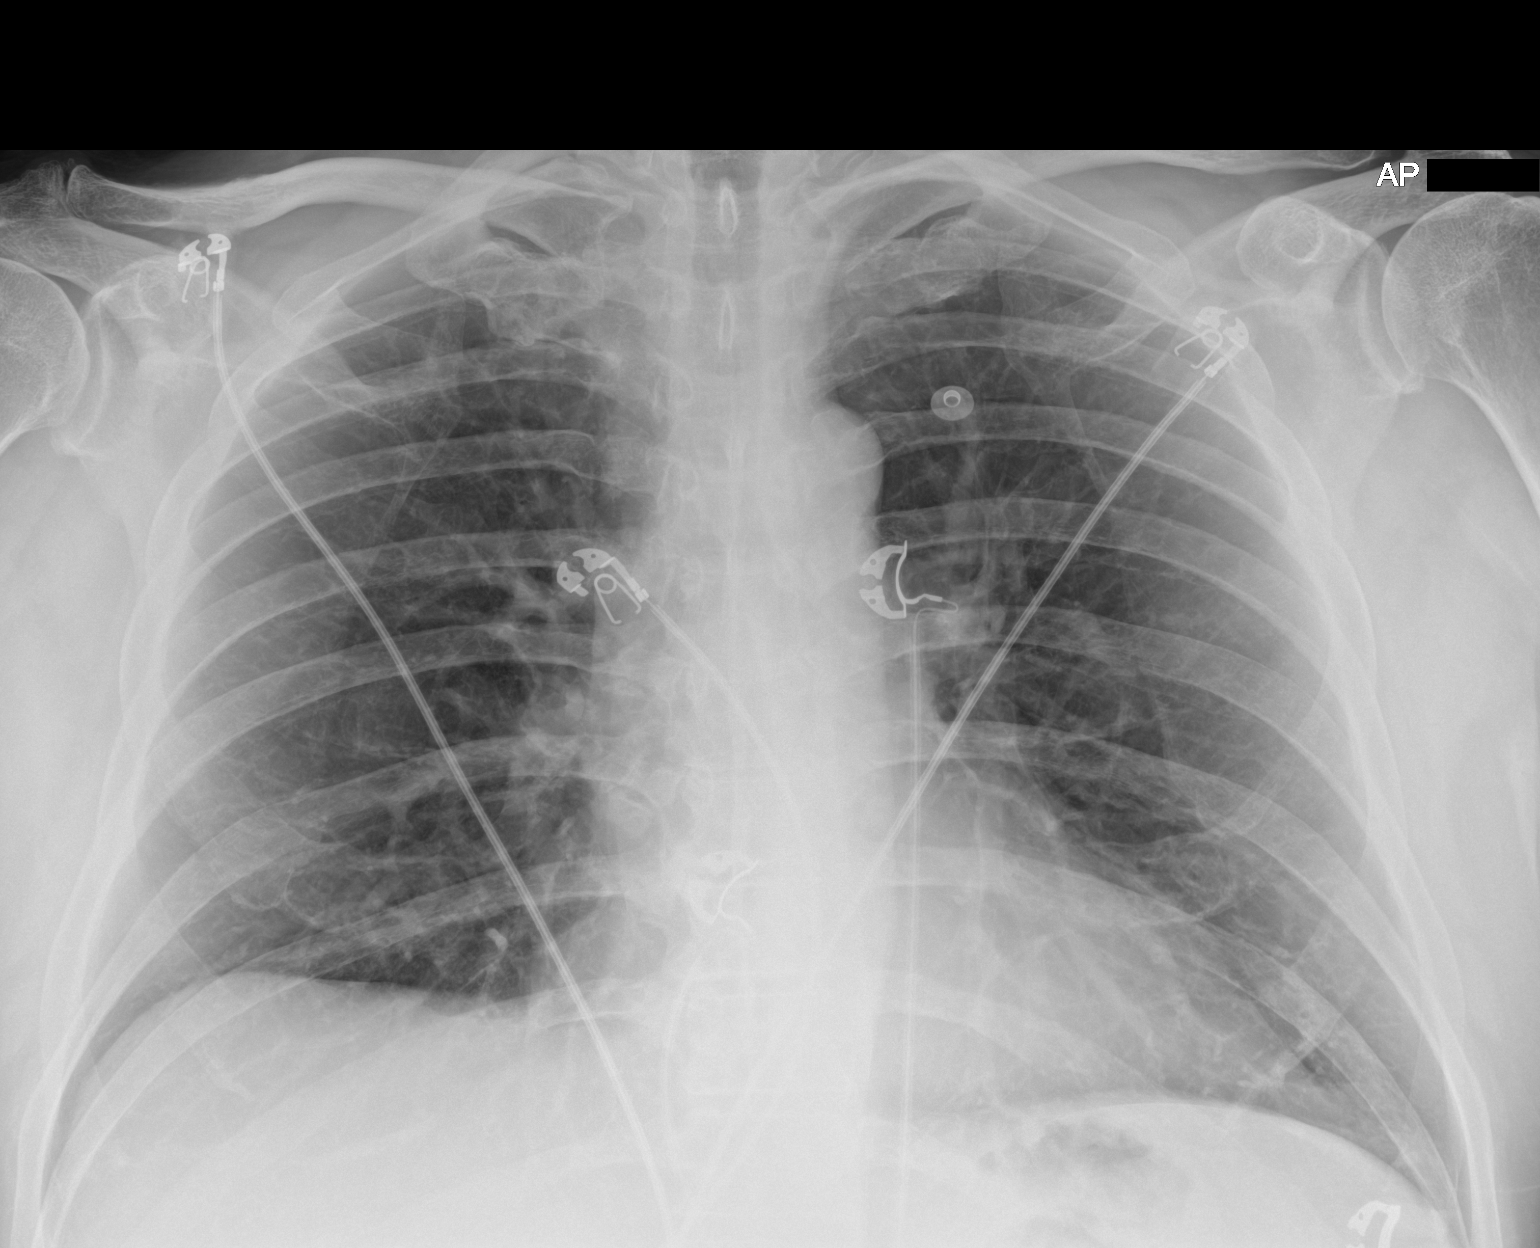

[chest ap (2 of 2)]
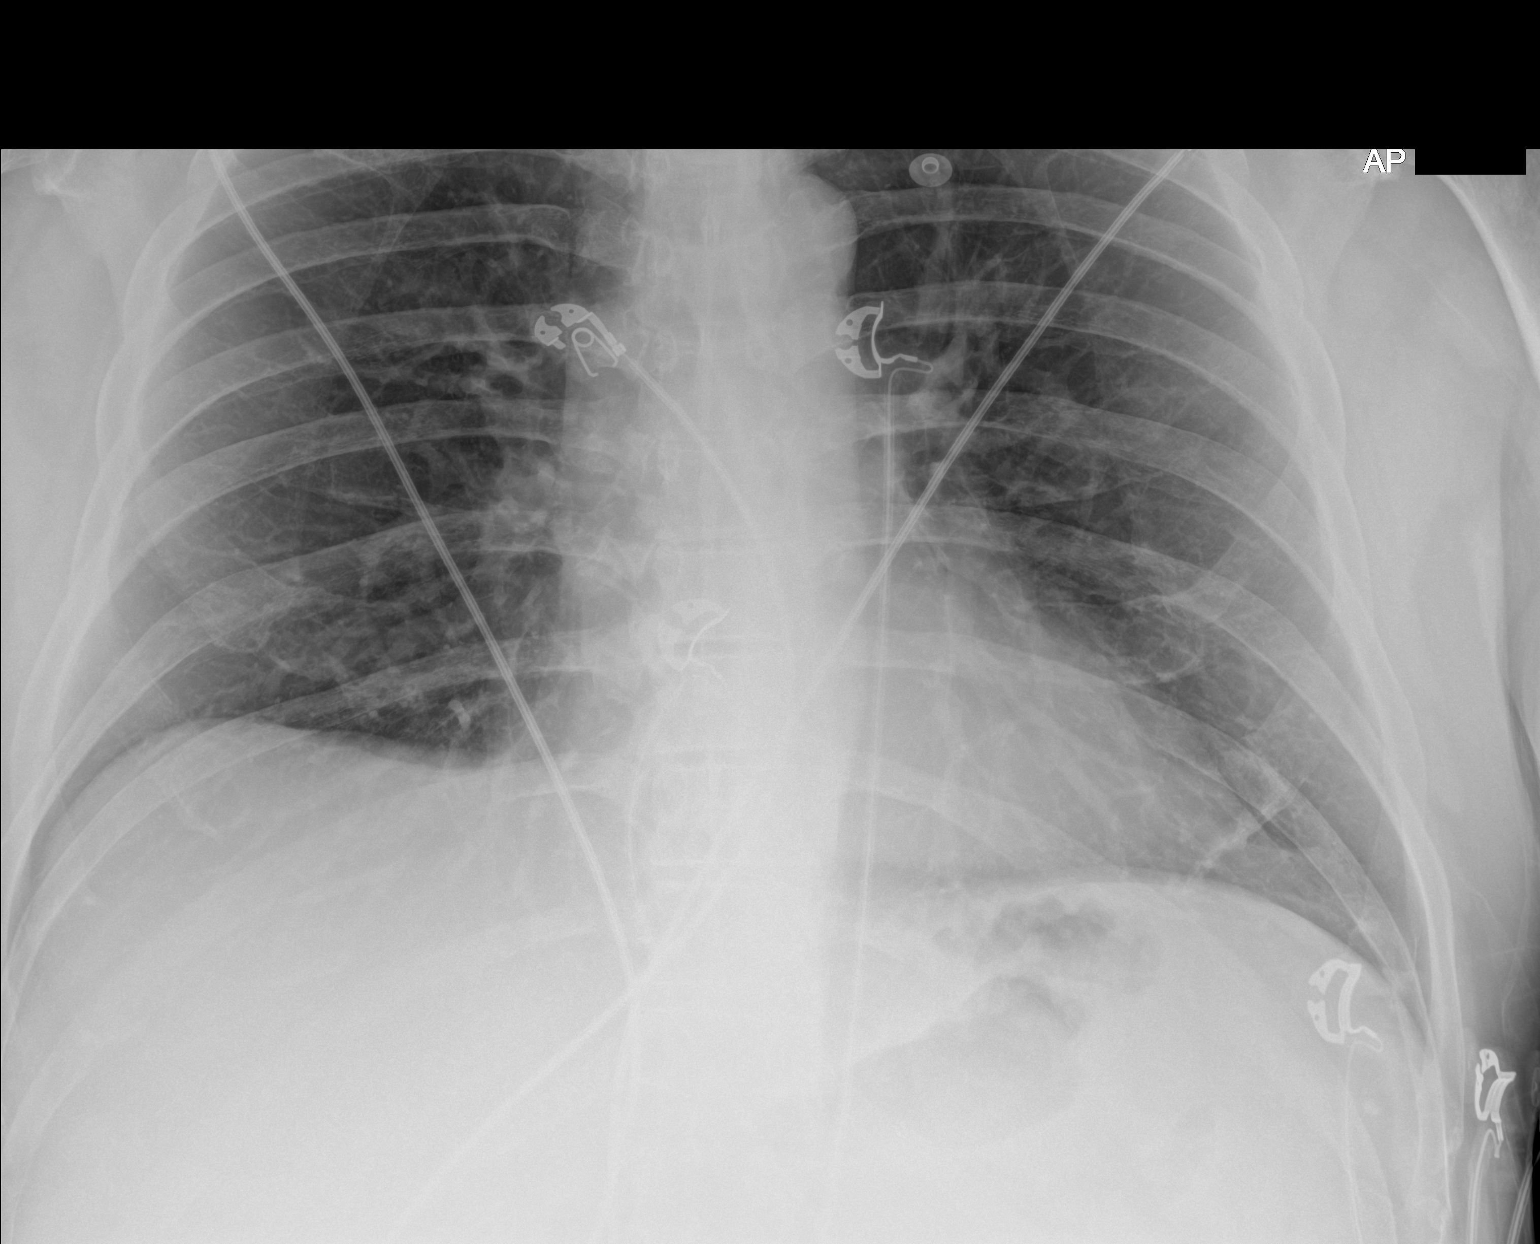

[2 of 2 positions shown; findings below may reference images not displayed]

FINDINGS: Heart size and mediastinal contours are within normal limits. No
suspicious pulmonary opacities identified.

No pleural effusion or pneumothorax visualized.

No acute osseous abnormality appreciated.
IMPRESSION: No acute intrathoracic process identified.
